# Patient Record
Sex: Male | Born: 1965
Health system: Southern US, Community
[De-identification: ages and names within clinical notes are randomized; demographics above are authoritative.]

## PROBLEM LIST (undated history)

## (undated) DIAGNOSIS — I10 Essential (primary) hypertension: Secondary | ICD-10-CM

## (undated) DIAGNOSIS — E785 Hyperlipidemia, unspecified: Secondary | ICD-10-CM

## (undated) DIAGNOSIS — G709 Myoneural disorder, unspecified: Secondary | ICD-10-CM

## (undated) HISTORY — PX: COLONOSCOPY: SHX174

## (undated) HISTORY — DX: Hyperlipidemia, unspecified: E78.5

## (undated) HISTORY — DX: Myoneural disorder, unspecified: G70.9

---

## 2004-11-07 ENCOUNTER — Ambulatory Visit: Payer: Self-pay | Admitting: Family Medicine

## 2004-11-13 ENCOUNTER — Ambulatory Visit: Payer: Self-pay | Admitting: Internal Medicine

## 2005-05-09 ENCOUNTER — Inpatient Hospital Stay (HOSPITAL_COMMUNITY): Admission: EM | Admit: 2005-05-09 | Discharge: 2005-05-11 | Payer: Self-pay | Admitting: Emergency Medicine

## 2005-05-10 ENCOUNTER — Encounter (INDEPENDENT_AMBULATORY_CARE_PROVIDER_SITE_OTHER): Payer: Self-pay | Admitting: *Deleted

## 2005-09-20 ENCOUNTER — Emergency Department (HOSPITAL_COMMUNITY): Admission: EM | Admit: 2005-09-20 | Discharge: 2005-09-20 | Payer: Self-pay | Admitting: Emergency Medicine

## 2011-01-30 ENCOUNTER — Ambulatory Visit (HOSPITAL_BASED_OUTPATIENT_CLINIC_OR_DEPARTMENT_OTHER): Payer: 59

## 2011-02-20 ENCOUNTER — Ambulatory Visit (HOSPITAL_BASED_OUTPATIENT_CLINIC_OR_DEPARTMENT_OTHER): Payer: 59 | Attending: Cardiology

## 2011-02-20 DIAGNOSIS — R0989 Other specified symptoms and signs involving the circulatory and respiratory systems: Secondary | ICD-10-CM | POA: Insufficient documentation

## 2011-02-20 DIAGNOSIS — G4730 Sleep apnea, unspecified: Secondary | ICD-10-CM | POA: Insufficient documentation

## 2011-02-20 DIAGNOSIS — G4710 Hypersomnia, unspecified: Secondary | ICD-10-CM | POA: Insufficient documentation

## 2011-02-20 DIAGNOSIS — R0609 Other forms of dyspnea: Secondary | ICD-10-CM | POA: Insufficient documentation

## 2011-02-22 ENCOUNTER — Encounter (HOSPITAL_BASED_OUTPATIENT_CLINIC_OR_DEPARTMENT_OTHER): Payer: 59

## 2011-02-24 DIAGNOSIS — G4730 Sleep apnea, unspecified: Secondary | ICD-10-CM

## 2011-02-24 DIAGNOSIS — R0989 Other specified symptoms and signs involving the circulatory and respiratory systems: Secondary | ICD-10-CM

## 2011-02-24 DIAGNOSIS — R0609 Other forms of dyspnea: Secondary | ICD-10-CM

## 2011-02-24 DIAGNOSIS — G4733 Obstructive sleep apnea (adult) (pediatric): Secondary | ICD-10-CM

## 2011-02-24 DIAGNOSIS — G4710 Hypersomnia, unspecified: Secondary | ICD-10-CM

## 2011-02-24 NOTE — Procedures (Signed)
NAMETREYVONE, CHELF                ACCOUNT NO.:  [id number]  MEDICAL RECORD NO.:  [id number]          PATIENT TYPE:  OUT  LOCATION:  SLEEP CENTER                 FACILITY:  [facility]  PHYSICIAN:  Angellica Maddison D. Maple Hudson, MD, FCCP, FACPDATE OF BIRTH:  05/18/1965  DATE OF STUDY:  02/20/2011                           NOCTURNAL POLYSOMNOGRAM  REFERRING PHYSICIAN:  JEROME O SPRUILL  INDICATION FOR STUDY:  Hypersomnia with sleep apnea.  EPWORTH SLEEPINESS SCORE:  12/24, BMI 26.6.  Weight 180 pounds, height 69 inches.  Neck 15.5 inches.  MEDICATIONS:  Home medications are charted and reviewed.  SLEEP ARCHITECTURE:  Total sleep time 247 minutes with sleep efficiency 62%.  Stage I was 16.8%, stage II 64.6%, stage III absent, REM 18.6% of total sleep time.  Sleep latency 132 minutes, REM latency 8 minutes, awake after sleep onset 19 minutes, arousal index 16.5.  BEDTIME MEDICATION:  None.  RESPIRATORY DATA:  Apnea-hypopnea index (AHI) one per hour.  A total of 4 events was scored, all as hypopneas.  The events were not positional. REM/AHI 3.9, RDI 7 per hour.  There were insufficient events to meet criteria for initiation of CPAP titration by split protocol on the study night.  OXYGEN DATA:  Mild-to-moderate snoring with oxygen desaturation to a nadir of 92% and a mean oxygen saturation through the study of 96.3% on room air.  CARDIAC DATA:  Sinus rhythm with rare PVC.  MOVEMENT-PARASOMNIA:  Occasional limb jerk with little effect on sleep. No bathroom trips.  IMPRESSIONS-RECOMMENDATIONS: 1. Delayed sleep onset until after midnight. 2. Occasional respiratory event with sleep disturbance, within normal     limits, AHI one per hour (normal range 2-5     per hour).  Mild to moderate snoring with oxygen desaturation to a     nadir of 92% and a mean oxygen saturation through the study of     96.3% on room air.     Desarea Ohagan D. Maple Hudson, MD, [facility], FACP Diplomate, Biomedical engineer of Sleep  Medicine Electronically Signed    CDY/MEDQ  D:  02/24/2011 09:48:05  T:  02/24/2011 11:05:04  Job:  [id number]

## 2011-03-16 NOTE — Discharge Summary (Signed)
Jason Pacheco, Jason Pacheco                ACCOUNT NO.:  [id number]   MEDICAL RECORD NO.:  [id number]          PATIENT TYPE:  INP   LOCATION:  3737                         FACILITY:  [facility]   PHYSICIAN:  Dani Gobble, MD       DATE OF BIRTH:  January 04, 1966   DATE OF ADMISSION:  05/09/2005  DATE OF DISCHARGE:  05/11/2005                                 DISCHARGE SUMMARY   DISCHARGE DIAGNOSES:  1.  Syncope, etiology unclear.  2.  Abnormal cardiac enzymes with electrocardiogram changes; patient      underwent cardiac catheterization this admission revealing normal      coronaries.  3.  Hyperlipidemia.  4.  Hypertension.   HPI HOSPITAL COURSE:  This is a 45 year old African-American gentleman who  has a history of significant hypertension and was recently discharged from  [facility] and because of abnormal EKG underwent 2D echo at  [facility].  The patient reported that the day  of his presentation, around 2:30 p.m., he felt that everything began to go  dark and he experienced left chest pain, but no nausea, vomiting or  diaphoresis, no palpitations, and the patient fell down suddenly and  experienced episodes of loss of consciousness probably for 5 minutes.  He  regained consciousness and probably was aroused for around 15 minutes but  then again sustained another episode of syncope for another 5 minutes.  Also  felt presyncopal over the weekend and experienced cramping and numbness in  his left leg, also felt lightheaded and had some palpitations earlier.  Because of all of his symptoms, patient was admitted to [facility]  to the Telemetry Unit on rule out MI protocol.   His EKG revealed some abnormal changes.  EKG revealed a T-wave inversion in  2, 3 and AVF and 1 and also precordial leads 3-5.  There is noticed R-wave  progression from V1-V5, but then in V6 the R-waves are lost again.   His cardiac panel __________ CK 378, MB 63 and troponin  0.01 and second set  CK 457, MB is 7.4, and troponin again 0.01.  His enzymes were mildly  abnormal but not characteristic for MI because of a low relative index.  His  relative index did not increase above 1.7.   Dr. Elsie Lincoln assessed patient on May 12, 2005, and decided to proceed with a  cardiac catheterization.   HOSPITAL PROCEDURES:  Cardiac catheterization performed on May 10, 2005,  that showed normal coronaries, ejection fraction 70-80% and no mitral  regurgitation.  His renal arteries were patent bilaterally and no __________  noted.   Fasting lipid panel revealed total cholesterol 207, triglycerides 115, HDL  65, LDL 119.   His hemoglobin was 14.5, hematocrit 43, potassium 3.9, sodium 141, BUN 9,  creatinine 1.1, glucose 99.   On the day of his discharge Dr. Domingo Sep evaluated patient and found him to  be stable for discharge home.  Patient was offered to start statin therapy  but he refused and said that he would like to try first dietary  modification.  DISCHARGE MEDICATIONS:  1.  Aspirin 81 mg daily.  2.  Lisinopril 5 mg daily.   DISCHARGE DIET:  Low fat and low salt diet.   DISCHARGE ACTIVITIES:  No driving, no lifting greater than 5 pounds, no  strenuous activities for 3 days post cath.   DISCHARGE FOLLOWUP:  Dr. Elsie Lincoln will see patient in our office on May 28, 2005, at 12 p.m.   His head CT that was performed at [facility] on admission was  negative for any hemorrhage or any acute abnormality and he might require  outpatient neurological evaluation and after Dr. Elsie Lincoln assesses patient,  post hospital, in the office we would arrange neurological consult.      Marina   MK/MEDQ  D:  05/11/2005  T:  05/11/2005  Job:  [id number]   cc:   [facility]  [address]  [place], [place] [address]

## 2011-03-16 NOTE — Cardiovascular Report (Signed)
Jason Pacheco, Jason Pacheco                ACCOUNT NO.:  [id number]   MEDICAL RECORD NO.:  [id number]          PATIENT TYPE:  INP   LOCATION:  3737                         FACILITY:  [facility]   PHYSICIAN:  Madaline Savage, M.D.DATE OF BIRTH:  July 24, 1966   DATE OF PROCEDURE:  05/10/2005  DATE OF DISCHARGE:                              CARDIAC CATHETERIZATION   PROCEDURES PERFORMED:  1.  Selective coronary angiography by Judkins technique.  2.  Retrograde left heart catheterization.  3.  Left ventricular angiography.  4.  Abdominal aortography.   ENTRY SITE:  Right femoral.   DYE USED:  Omnipaque.   COMPLICATIONS:  None.   PATIENT PROFILE:  The patient is a 45 year old African-American gentleman  who was recently diagnosed with an abnormal EKG with known hypertension. He  had a sensation of presyncope on the day of admission and was admitted to  the hospital for that reason. He was found to be in sinus rhythm with no  obvious cause of presyncopal episodes. His EKG was very abnormal with T-wave  inversions in the inferior anterolateral leads and his CKs were elevated but  his MBs were negative for myocardial infarction and his troponins were  negative. He was brought to the cath lab today to clarify whether his T-wave  abnormalities were related to ischemia or not. He tolerated the procedure  well and there were no complications.   RESULTS:  The central aortic pressure was 115/70 with a mean of 90. The left  ventricular pressure was 120/6 and end-diastolic pressure 16. There was no  significant gradient by pullback technique.   ANGIOGRAPHIC RESULTS:  The patient's coronary arteries were patent  throughout. The coronary tree consisted of a normal length and normal  diameter left main that was clean and LAD that coursed to the cardiac apex  with a wraparound configuration and a single diagonal branch arising very  proximal to the first septal perforator branch all of which were  normal.   Left circumflex coronary artery was nondominant. There were three obtuse  marginal branches, all were normal and the circumflex was normal.   Right coronary artery:  No evidence of any stenoses in this dominant vessel.  Abdominal aortography failed to show any pathology in the abdominal aorta or  in the renal arteries.   IMPRESSION:  1.  Recent syncopal episode with hypertension and markedly abnormal      electrocardiogram with T-wave inversions in inferior and anterolateral  2.  Elevated CKs with negative MBs and troponins.  3.  Angiographic patent coronary arteries.  4.  Normal left ventricular systolic function.  5.  No evidence of renal artery stenosis.   PLAN:  The patient will have further observation and investigation of his  syncope and will be reassured with regard to his coronary arteries status.       WHG/MEDQ  D:  05/10/2005  T:  05/11/2005  Job:  [id number]   cc:   [facility]   [facility]

## 2015-07-10 ENCOUNTER — Emergency Department (INDEPENDENT_AMBULATORY_CARE_PROVIDER_SITE_OTHER)
Admission: EM | Admit: 2015-07-10 | Discharge: 2015-07-10 | Disposition: A | Discharge status: Home / Self Care | Payer: Self-pay | Source: Home / Self Care | Attending: Family Medicine | Admitting: Family Medicine

## 2015-07-10 ENCOUNTER — Encounter (HOSPITAL_COMMUNITY): Payer: Self-pay | Admitting: Emergency Medicine

## 2015-07-10 DIAGNOSIS — I10 Essential (primary) hypertension: Secondary | ICD-10-CM

## 2015-07-10 HISTORY — DX: Essential (primary) hypertension: I10

## 2015-07-10 LAB — POCT I-STAT, CHEM 8
BUN: 11 mg/dL (ref 6–20)
Calcium, Ion: 1.17 mmol/L (ref 1.12–1.23)
Chloride: 105 mmol/L (ref 101–111)
Creatinine, Ser: 1.3 mg/dL — ABNORMAL HIGH (ref 0.61–1.24)
Glucose, Bld: 82 mg/dL (ref 65–99)
HCT: 50 % (ref 39.0–52.0)
Hemoglobin: 17 g/dL (ref 13.0–17.0)
Potassium: 4.4 mmol/L (ref 3.5–5.1)
Sodium: 140 mmol/L (ref 135–145)
TCO2: 22 mmol/L (ref 0–100)

## 2015-07-10 MED ORDER — LISINOPRIL-HYDROCHLOROTHIAZIDE 20-12.5 MG PO TABS: 1.0000 | ORAL_TABLET | Freq: Every day | ORAL | Status: DC

## 2015-07-10 MED ORDER — CLONIDINE HCL 0.1 MG PO TABS
ORAL_TABLET | ORAL | Status: AC
  Filled 2015-07-10: qty 1

## 2015-07-10 MED ORDER — CLONIDINE HCL 0.1 MG PO TABS
0.1000 mg | ORAL_TABLET | Freq: Once | ORAL | Status: AC
  Administered 2015-07-10: 0.1 mg via ORAL

## 2015-07-10 NOTE — ED Provider Notes (Addendum)
CSN: [id number]     Arrival date & time 07/10/15  1355 History   First MD Initiated Contact with Patient 07/10/15 1501     Chief Complaint  Patient presents with  . Hypertension  . Dizziness   (Consider location/radiation/quality/duration/timing/severity/associated sxs/prior Treatment) Patient is a 49 y.o. male presenting with hypertension and dizziness. The history is provided by the patient.  Hypertension This is a chronic problem. The current episode started more than 1 week ago. The problem occurs constantly. The problem has not changed since onset.Associated symptoms include headaches. Pertinent negatives include no chest pain, no abdominal pain and no shortness of breath. Nothing aggravates the symptoms. Nothing relieves the symptoms. He has tried nothing for the symptoms.  Dizziness Associated symptoms: headaches   Associated symptoms: no chest pain and no shortness of breath    Patient is unemployed and although his hypertension was controlled with lisinopril the past, has not been able to afford any medical care in recent weeks. Past Medical History  Diagnosis Date  . Hypertension    History reviewed. No pertinent past surgical history. No family history on file. Social History  Substance Use Topics  . Smoking status: Current Every Day Smoker  . Smokeless tobacco: None  . Alcohol Use: Yes    Review of Systems  Constitutional: Negative.   Eyes: Negative.   Respiratory: Negative.  Negative for shortness of breath.   Cardiovascular: Negative.  Negative for chest pain.  Gastrointestinal: Negative for abdominal pain.  Endocrine: Negative.   Genitourinary: Negative.   Neurological: Positive for dizziness and headaches.    Allergies  Review of patient's allergies indicates no known allergies.  Home Medications   Prior to Admission medications   Not on File   Meds Ordered and Administered this Visit  Medications - No data to display  BP 196/124 mmHg  Pulse 60   Temp(Src) 99.3 F (37.4 C) (Oral)  Resp 18  SpO2 99% No data found.   Physical Exam  ED Course  Procedures (including critical care time)  Labs Review Results for orders placed or performed during the hospital encounter of 07/10/15  I-STAT, chem 8  Result Value Ref Range   Sodium 140 135 - 145 mmol/L   Potassium 4.4 3.5 - 5.1 mmol/L   Chloride 105 101 - 111 mmol/L   BUN 11 6 - 20 mg/dL   Creatinine, Ser 1.30 (H) 0.61 - 1.24 mg/dL   Glucose, Bld 82 65 - 99 mg/dL   Calcium, Ion 1.17 1.12 - 1.23 mmol/L   TCO2 22 0 - 100 mmol/L   Hemoglobin 17.0 13.0 - 17.0 g/dL   HCT 50.0 39.0 - 52.0 %      MDM   Patient cautioned to take his medicine and get follow-up. He has some early signs of renal impairment and the swelling get worse if he does not pay attention to this serious problem.    ICD-9-CM ICD-10-CM   1. Essential hypertension 401.9 I10 lisinopril-hydrochlorothiazide (ZESTORETIC) 20-12.5 MG per tablet    Clonidine 0.1 mg po here in the department  Signed, Robyn Haber M.D.   Robyn Haber, MD 07/10/15 1525

## 2015-07-10 NOTE — Discharge Instructions (Signed)

## 2015-07-10 NOTE — ED Notes (Signed)
Patient has a history of high blood pressure.  Patient has been off blood pressure medicines for 4-5 years.  Patient reports frontal headaches and dizziness over the past week have intensified.

## 2015-07-10 NOTE — ED Notes (Signed)
Provided patient with maggie mena, financial account specialist's contact information.  No insurance, no pcp

## 2015-12-13 ENCOUNTER — Encounter (HOSPITAL_COMMUNITY): Payer: Self-pay | Admitting: Emergency Medicine

## 2015-12-13 ENCOUNTER — Emergency Department (HOSPITAL_COMMUNITY)
Admission: EM | Admit: 2015-12-13 | Discharge: 2015-12-13 | Disposition: A | Discharge status: Home / Self Care | Payer: Self-pay | Source: Home / Self Care | Attending: Family Medicine | Admitting: Family Medicine

## 2015-12-13 DIAGNOSIS — I10 Essential (primary) hypertension: Secondary | ICD-10-CM

## 2015-12-13 MED ORDER — LISINOPRIL-HYDROCHLOROTHIAZIDE 20-12.5 MG PO TABS: 1.0000 | ORAL_TABLET | Freq: Every day | ORAL | Status: DC

## 2015-12-13 NOTE — ED Notes (Signed)
Here needing refills on his BP meds A&O x4.. No acute distress.

## 2015-12-13 NOTE — ED Provider Notes (Signed)
CSN: [id number]     Arrival date & time 12/13/15  1650 History   First MD Initiated Contact with Patient 12/13/15 1735     Chief Complaint  Patient presents with  . Medication Refill   (Consider location/radiation/quality/duration/timing/severity/associated sxs/prior Treatment) HPI Presents for refill of lisinopril. Has been doing well. Not established with PCP. No complaints Past Medical History  Diagnosis Date  . Hypertension    History reviewed. No pertinent past surgical history. No family history on file. Social History  Substance Use Topics  . Smoking status: Current Every Day Smoker  . Smokeless tobacco: None  . Alcohol Use: Yes    Review of Systems Refill of medications. No CP, SOB, HA Allergies  Review of patient's allergies indicates no known allergies.  Home Medications   Prior to Admission medications   Medication Sig Start Date End Date Taking? Authorizing Provider  lisinopril-hydrochlorothiazide (ZESTORETIC) 20-12.5 MG tablet Take 1 tablet by mouth daily. 12/13/15   Konrad Felix, PA  lisinopril-hydrochlorothiazide (ZESTORETIC) 20-12.5 MG tablet Take 1 tablet by mouth daily. 12/13/15   Konrad Felix, PA   Meds Ordered and Administered this Visit  Medications - No data to display  BP 141/88 mmHg  Pulse 83  Temp(Src) 98.9 F (37.2 C) (Oral)  Resp 16  SpO2 98% No data found.   Physical Exam  Constitutional: He is oriented to person, place, and time. He appears well-developed and well-nourished. No distress.  Eyes: Conjunctivae are normal.  Neck: Normal range of motion. Neck supple.  Cardiovascular: Normal rate.   Pulmonary/Chest: Effort normal.  Musculoskeletal: Normal range of motion.  Neurological: He is alert and oriented to person, place, and time.  Skin: Skin is warm and dry.  Psychiatric: He has a normal mood and affect. His behavior is normal.  Nursing note and vitals reviewed.   ED Course  Procedures (including critical care  time)  Labs Review Labs Reviewed - No data to display  Imaging Review No results found.   Visual Acuity Review  Right Eye Distance:   Left Eye Distance:   Bilateral Distance:    Right Eye Near:   Left Eye Near:    Bilateral Near:         MDM   1. Essential hypertension    Patient is advised to continue home symptomatic treatment. Prescription for lisinopril sent pharmacy patient has indicated. Patient is advised that if there are new or worsening symptoms or attend the emergency department, or contact primary care provider. Instructions of care provided discharged home in stable condition. Return to work/school note provided.  THIS NOTE WAS GENERATED USING A VOICE RECOGNITION SOFTWARE PROGRAM. ALL REASONABLE EFFORTS  WERE MADE TO PROOFREAD THIS DOCUMENT FOR ACCURACY.     Konrad Felix, PA 12/13/15 1800

## 2015-12-13 NOTE — Discharge Instructions (Signed)
Hypertension Hypertension, commonly called high blood pressure, is when the force of blood pumping through your arteries is too strong. Your arteries are the blood vessels that carry blood from your heart throughout your body. A blood pressure reading consists of a higher number over a lower number, such as 110/72. The higher number (systolic) is the pressure inside your arteries when your heart pumps. The lower number (diastolic) is the pressure inside your arteries when your heart relaxes. Ideally you want your blood pressure below 120/80. Hypertension forces your heart to work harder to pump blood. Your arteries may become narrow or stiff. Having untreated or uncontrolled hypertension can cause heart attack, stroke, kidney disease, and other problems. RISK FACTORS Some risk factors for high blood pressure are controllable. Others are not.  Risk factors you cannot control include:   Race. You may be at higher risk if you are African American.  Age. Risk increases with age.  Gender. Men are at higher risk than women before age 45 years. After age 65, women are at higher risk than men. Risk factors you can control include:  Not getting enough exercise or physical activity.  Being overweight.  Getting too much fat, sugar, calories, or salt in your diet.  Drinking too much alcohol. SIGNS AND SYMPTOMS Hypertension does not usually cause signs or symptoms. Extremely high blood pressure (hypertensive crisis) may cause headache, anxiety, shortness of breath, and nosebleed. DIAGNOSIS To check if you have hypertension, your health care provider will measure your blood pressure while you are seated, with your arm held at the level of your heart. It should be measured at least twice using the same arm. Certain conditions can cause a difference in blood pressure between your right and left arms. A blood pressure reading that is higher than normal on one occasion does not mean that you need treatment. If  it is not clear whether you have high blood pressure, you may be asked to return on a different day to have your blood pressure checked again. Or, you may be asked to monitor your blood pressure at home for 1 or more weeks. TREATMENT Treating high blood pressure includes making lifestyle changes and possibly taking medicine. Living a healthy lifestyle can help lower high blood pressure. You may need to change some of your habits. Lifestyle changes may include:  Following the DASH diet. This diet is high in fruits, vegetables, and whole grains. It is low in salt, red meat, and added sugars.  Keep your sodium intake below 2,300 mg per day.  Getting at least 30-45 minutes of aerobic exercise at least 4 times per week.  Losing weight if necessary.  Not smoking.  Limiting alcoholic beverages.  Learning ways to reduce stress. Your health care provider may prescribe medicine if lifestyle changes are not enough to get your blood pressure under control, and if one of the following is true:  You are 18-59 years of age and your systolic blood pressure is above 140.  You are 60 years of age or older, and your systolic blood pressure is above 150.  Your diastolic blood pressure is above 90.  You have diabetes, and your systolic blood pressure is over 140 or your diastolic blood pressure is over 90.  You have kidney disease and your blood pressure is above 140/90.  You have heart disease and your blood pressure is above 140/90. Your personal target blood pressure may vary depending on your medical conditions, your age, and other factors. HOME CARE INSTRUCTIONS    Have your blood pressure rechecked as directed by your health care provider.   Take medicines only as directed by your health care provider. Follow the directions carefully. Blood pressure medicines must be taken as prescribed. The medicine does not work as well when you skip doses. Skipping doses also puts you at risk for  problems.  Do not smoke.   Monitor your blood pressure at home as directed by your health care provider. SEEK MEDICAL CARE IF:   You think you are having a reaction to medicines taken.  You have recurrent headaches or feel dizzy.  You have swelling in your ankles.  You have trouble with your vision. SEEK IMMEDIATE MEDICAL CARE IF:  You develop a severe headache or confusion.  You have unusual weakness, numbness, or feel faint.  You have severe chest or abdominal pain.  You vomit repeatedly.  You have trouble breathing. MAKE SURE YOU:   Understand these instructions.  Will watch your condition.  Will get help right away if you are not doing well or get worse.   This information is not intended to replace advice given to you by your health care provider. Make sure you discuss any questions you have with your health care provider.   Document Released: 10/15/2005 Document Revised: 03/01/2015 Document Reviewed: 08/07/2013 Elsevier Interactive Patient Education 2016 [employer] Inc.  

## 2015-12-13 NOTE — ED Notes (Signed)
D/c by frank patrick, pa 

## 2015-12-17 ENCOUNTER — Encounter (HOSPITAL_COMMUNITY): Payer: Self-pay | Admitting: Emergency Medicine

## 2015-12-17 ENCOUNTER — Emergency Department (HOSPITAL_COMMUNITY): Payer: Self-pay

## 2015-12-17 ENCOUNTER — Observation Stay (HOSPITAL_COMMUNITY)
Admission: EM | Admit: 2015-12-17 | Discharge: 2015-12-20 | Disposition: A | Discharge status: Home / Self Care | Payer: Self-pay | Attending: Internal Medicine | Admitting: Internal Medicine

## 2015-12-17 DIAGNOSIS — Z23 Encounter for immunization: Secondary | ICD-10-CM | POA: Insufficient documentation

## 2015-12-17 DIAGNOSIS — I10 Essential (primary) hypertension: Secondary | ICD-10-CM

## 2015-12-17 DIAGNOSIS — R200 Anesthesia of skin: Secondary | ICD-10-CM | POA: Insufficient documentation

## 2015-12-17 DIAGNOSIS — F17210 Nicotine dependence, cigarettes, uncomplicated: Secondary | ICD-10-CM | POA: Insufficient documentation

## 2015-12-17 DIAGNOSIS — R55 Syncope and collapse: Principal | ICD-10-CM | POA: Diagnosis present

## 2015-12-17 DIAGNOSIS — N179 Acute kidney failure, unspecified: Secondary | ICD-10-CM | POA: Insufficient documentation

## 2015-12-17 DIAGNOSIS — M79602 Pain in left arm: Secondary | ICD-10-CM | POA: Diagnosis present

## 2015-12-17 DIAGNOSIS — M79601 Pain in right arm: Secondary | ICD-10-CM | POA: Insufficient documentation

## 2015-12-17 LAB — I-STAT TROPONIN, ED
Troponin i, poc: 0 ng/mL (ref 0.00–0.08)
Troponin i, poc: 0 ng/mL (ref 0.00–0.08)

## 2015-12-17 LAB — COMPREHENSIVE METABOLIC PANEL
ALT: 25 U/L (ref 17–63)
AST: 28 U/L (ref 15–41)
Albumin: 3.8 g/dL (ref 3.5–5.0)
Alkaline Phosphatase: 78 U/L (ref 38–126)
Anion gap: 14 (ref 5–15)
BUN: 15 mg/dL (ref 6–20)
CO2: 22 mmol/L (ref 22–32)
Calcium: 9.5 mg/dL (ref 8.9–10.3)
Chloride: 103 mmol/L (ref 101–111)
Creatinine, Ser: 1.51 mg/dL — ABNORMAL HIGH (ref 0.61–1.24)
GFR calc Af Amer: >60 mL/min (ref >60)
GFR calc non Af Amer: 52 mL/min — ABNORMAL LOW (ref >60)
Glucose, Bld: 97 mg/dL (ref 65–99)
Potassium: 4.3 mmol/L (ref 3.5–5.1)
Sodium: 139 mmol/L (ref 135–145)
Total Bilirubin: 0.7 mg/dL (ref 0.3–1.2)
Total Protein: 7.2 g/dL (ref 6.5–8.1)

## 2015-12-17 LAB — ETHANOL: ALCOHOL ETHYL (B): 15 mg/dL — AB (ref <5)

## 2015-12-17 LAB — CBC WITH DIFFERENTIAL/PLATELET
Basophils Absolute: 0 10*3/uL (ref 0.0–0.1)
Basophils Relative: 1 %
Eosinophils Absolute: 0.1 10*3/uL (ref 0.0–0.7)
Eosinophils Relative: 2 %
HCT: 44.6 % (ref 39.0–52.0)
Hemoglobin: 15.1 g/dL (ref 13.0–17.0)
Lymphocytes Relative: 16 %
Lymphs Abs: 1.1 10*3/uL (ref 0.7–4.0)
MCH: 28.8 pg (ref 26.0–34.0)
MCHC: 33.9 g/dL (ref 30.0–36.0)
MCV: 85 fL (ref 78.0–100.0)
Monocytes Absolute: 0.5 10*3/uL (ref 0.1–1.0)
Monocytes Relative: 7 %
Neutro Abs: 4.9 10*3/uL (ref 1.7–7.7)
Neutrophils Relative %: 74 %
Platelets: 313 10*3/uL (ref 150–400)
RBC: 5.25 MIL/uL (ref 4.22–5.81)
RDW: 13.4 % (ref 11.5–15.5)
WBC: 6.5 10*3/uL (ref 4.0–10.5)

## 2015-12-17 LAB — URINALYSIS, ROUTINE W REFLEX MICROSCOPIC
Bilirubin Urine: NEGATIVE
Glucose, UA: NEGATIVE mg/dL
Hgb urine dipstick: NEGATIVE
Ketones, ur: 15 mg/dL — AB
Leukocytes, UA: NEGATIVE
Nitrite: NEGATIVE
Protein, ur: NEGATIVE mg/dL
Specific Gravity, Urine: 1.018 (ref 1.005–1.030)
pH: 6 (ref 5.0–8.0)

## 2015-12-17 LAB — TROPONIN I: Troponin I: <0.03 ng/mL

## 2015-12-17 MED ORDER — INFLUENZA VAC SPLIT QUAD 0.5 ML IM SUSY
0.5000 mL | PREFILLED_SYRINGE | INTRAMUSCULAR | Status: AC
  Administered 2015-12-19: 0.5 mL via INTRAMUSCULAR
  Filled 2015-12-17: qty 0.5

## 2015-12-17 MED ORDER — SODIUM CHLORIDE 0.9% FLUSH
3.0000 mL | Freq: Two times a day (BID) | INTRAVENOUS | Status: DC
  Administered 2015-12-18 – 2015-12-19 (×4): 3 mL via INTRAVENOUS

## 2015-12-17 MED ORDER — ALUM & MAG HYDROXIDE-SIMETH 200-200-20 MG/5ML PO SUSP
30.0000 mL | Freq: Four times a day (QID) | ORAL | Status: DC | PRN
Start: 2015-12-17 — End: 2015-12-20

## 2015-12-17 MED ORDER — THIAMINE HCL 100 MG/ML IJ SOLN
100.0000 mg | Freq: Every day | INTRAMUSCULAR | Status: DC
  Filled 2015-12-17: qty 2

## 2015-12-17 MED ORDER — OXYCODONE HCL 5 MG PO TABS
5.0000 mg | ORAL_TABLET | ORAL | Status: DC | PRN
  Administered 2015-12-17 – 2015-12-19 (×3): 5 mg via ORAL
  Filled 2015-12-17 (×3): qty 1

## 2015-12-17 MED ORDER — NICOTINE 14 MG/24HR TD PT24
14.0000 mg | MEDICATED_PATCH | Freq: Every day | TRANSDERMAL | Status: DC
  Administered 2015-12-18 – 2015-12-20 (×3): 14 mg via TRANSDERMAL
  Filled 2015-12-17 (×5): qty 1

## 2015-12-17 MED ORDER — ACETAMINOPHEN 325 MG PO TABS: 650.0000 mg | ORAL_TABLET | Freq: Four times a day (QID) | ORAL | Status: DC | PRN

## 2015-12-17 MED ORDER — ONDANSETRON HCL 4 MG/2ML IJ SOLN: 4.0000 mg | Freq: Four times a day (QID) | INTRAMUSCULAR | Status: DC | PRN

## 2015-12-17 MED ORDER — ENOXAPARIN SODIUM 40 MG/0.4ML ~~LOC~~ SOLN
40.0000 mg | SUBCUTANEOUS | Status: DC
  Administered 2015-12-17 – 2015-12-19 (×3): 40 mg via SUBCUTANEOUS
  Filled 2015-12-17 (×3): qty 0.4

## 2015-12-17 MED ORDER — SODIUM CHLORIDE 0.9 % IV SOLN
INTRAVENOUS | Status: AC
  Administered 2015-12-17: 22:00:00 via INTRAVENOUS

## 2015-12-17 MED ORDER — LORAZEPAM 1 MG PO TABS: 0.0000 mg | ORAL_TABLET | Freq: Two times a day (BID) | ORAL | Status: DC

## 2015-12-17 MED ORDER — HYDROMORPHONE HCL 1 MG/ML IJ SOLN: 0.5000 mg | INTRAMUSCULAR | Status: DC | PRN

## 2015-12-17 MED ORDER — ONDANSETRON HCL 4 MG PO TABS: 4.0000 mg | ORAL_TABLET | Freq: Four times a day (QID) | ORAL | Status: DC | PRN

## 2015-12-17 MED ORDER — ASPIRIN 325 MG PO TABS
325.0000 mg | ORAL_TABLET | Freq: Every day | ORAL | Status: DC
  Administered 2015-12-18 – 2015-12-20 (×3): 325 mg via ORAL
  Filled 2015-12-17 (×3): qty 1

## 2015-12-17 MED ORDER — SODIUM CHLORIDE 0.9 % IV BOLUS (SEPSIS)
1000.0000 mL | Freq: Once | INTRAVENOUS | Status: AC
  Administered 2015-12-17: 1000 mL via INTRAVENOUS

## 2015-12-17 MED ORDER — ACETAMINOPHEN 650 MG RE SUPP: 650.0000 mg | Freq: Four times a day (QID) | RECTAL | Status: DC | PRN

## 2015-12-17 MED ORDER — VITAMIN B-1 100 MG PO TABS
100.0000 mg | ORAL_TABLET | Freq: Every day | ORAL | Status: DC
  Administered 2015-12-18 – 2015-12-20 (×3): 100 mg via ORAL
  Filled 2015-12-17 (×3): qty 1

## 2015-12-17 MED ORDER — LORAZEPAM 1 MG PO TABS
1.0000 mg | ORAL_TABLET | Freq: Four times a day (QID) | ORAL | Status: DC | PRN
  Administered 2015-12-18: 1 mg via ORAL

## 2015-12-17 MED ORDER — LORAZEPAM 1 MG PO TABS
0.0000 mg | ORAL_TABLET | Freq: Four times a day (QID) | ORAL | Status: AC
Start: 2015-12-17 — End: 2015-12-19
  Filled 2015-12-17: qty 1

## 2015-12-17 MED ORDER — LORAZEPAM 2 MG/ML IJ SOLN: 1.0000 mg | Freq: Four times a day (QID) | INTRAMUSCULAR | Status: DC | PRN

## 2015-12-17 MED ORDER — FOLIC ACID 1 MG PO TABS
1.0000 mg | ORAL_TABLET | Freq: Every day | ORAL | Status: DC
  Administered 2015-12-18 – 2015-12-20 (×3): 1 mg via ORAL
  Filled 2015-12-17 (×4): qty 1

## 2015-12-17 MED ORDER — ADULT MULTIVITAMIN W/MINERALS CH
1.0000 | ORAL_TABLET | Freq: Every day | ORAL | Status: DC
  Administered 2015-12-18 – 2015-12-20 (×3): 1 via ORAL
  Filled 2015-12-17 (×4): qty 1

## 2015-12-17 NOTE — ED Notes (Signed)
Pt to ER via [facility] from home where patient had syncopal episode. Pt is a daily beer drinker, today pt reports two 40 oz ETOH on board. Per EMS, 12 lead showing elevation in V3, V4, and V5. Pt denies chest pain. Pt was given 324 of aspirin in route. Pt is a/o x4. Pt has c collar in place. Denies pain except in bilateral hands, pt has skin tear to right middle finger.

## 2015-12-17 NOTE — ED Provider Notes (Signed)
CSN: [id number]     Arrival date & time 12/17/15  1423 History   First MD Initiated Contact with Patient 12/17/15 1522     Chief Complaint  Patient presents with  . Loss of Consciousness     (Consider location/radiation/quality/duration/timing/severity/associated sxs/prior Treatment) HPI Patient presents to the emergency department with a syncopal episode that occurred just prior to arrival.  Patient states he had been drinking 240 ounce beers today, but had a sudden syncopal episode.  The patient states that not having any chest pain, shortness of breath, nausea, vomiting, headache, blurred vision, weakness, dizziness, back pain, neck pain, fever, rash, cough, pain, numbness, lightheadedness, or edema.  The patient states that nothing seemed to make his condition, better or worse.  The patient states that he is normally everyday drinker Past Medical History  Diagnosis Date  . Hypertension    History reviewed. No pertinent past surgical history. History reviewed. No pertinent family history. Social History  Substance Use Topics  . Smoking status: Current Every Day Smoker -- 1.00 packs/day    Types: Cigarettes  . Smokeless tobacco: None  . Alcohol Use: Yes     Comment: 2 40 oz    Review of Systems  All other systems negative except as documented in the HPI. All pertinent positives and negatives as reviewed in the HPI.  Allergies  Review of patient's allergies indicates no known allergies.  Home Medications   Prior to Admission medications   Medication Sig Start Date End Date Taking? Authorizing Provider  lisinopril-hydrochlorothiazide (ZESTORETIC) 20-12.5 MG tablet Take 1 tablet by mouth daily. 12/13/15  Yes Konrad Felix, PA  lisinopril-hydrochlorothiazide (ZESTORETIC) 20-12.5 MG tablet Take 1 tablet by mouth daily. 12/13/15   Konrad Felix, PA   BP 156/104 mmHg  Pulse 78  Temp(Src) 98 F (36.7 C) (Oral)  Resp 18  SpO2 100% Physical Exam  Constitutional: He is  oriented to person, place, and time. He appears well-developed and well-nourished. No distress.  HENT:  Head: Normocephalic and atraumatic.  Mouth/Throat: Oropharynx is clear and moist.  Eyes: Pupils are equal, round, and reactive to light.  Neck: Normal range of motion. Neck supple.  Cardiovascular: Normal rate, regular rhythm and normal heart sounds.  Exam reveals no gallop and no friction rub.   No murmur heard. Pulmonary/Chest: Effort normal and breath sounds normal. No respiratory distress. He has no wheezes.  Abdominal: Soft. Bowel sounds are normal. He exhibits no distension. There is no tenderness.  Neurological: He is alert and oriented to person, place, and time. He exhibits normal muscle tone. Coordination normal.  Skin: Skin is warm and dry. No rash noted. No erythema.  Psychiatric: He has a normal mood and affect. His behavior is normal.  Nursing note and vitals reviewed.   ED Course  Procedures (including critical care time) Labs Review Labs Reviewed  URINALYSIS, ROUTINE W REFLEX MICROSCOPIC (NOT AT [facility]) - Abnormal; Notable for the following:    Ketones, ur 15 (*)    All other components within normal limits  COMPREHENSIVE METABOLIC PANEL - Abnormal; Notable for the following:    Creatinine, Ser 1.51 (*)    GFR calc non Af Amer 52 (*)    All other components within normal limits  CBC WITH DIFFERENTIAL/PLATELET  ETHANOL  I-STAT TROPOININ, ED  Randolm Idol, ED    Imaging Review Dg Chest 2 View  12/17/2015  CLINICAL DATA:  Syncope. EXAM: CHEST  2 VIEW COMPARISON:  09/20/2005 FINDINGS: Normal sized heart. Clear lungs with  normal vascularity. Mild thoracic spine degenerative changes. IMPRESSION: No acute abnormality. Electronically Signed   By: Claudie Revering M.D.   On: 12/17/2015 16:30   I have personally reviewed and evaluated these images and lab results as part of my medical decision-making.   EKG Interpretation   Date/Time:  Saturday December 17 2015  14:31:04 EST Ventricular Rate:  81 PR Interval:  187 QRS Duration: 74 QT Interval:  332 QTC Calculation: 385 R Axis:   72 Text Interpretation:  Sinus rhythm Probable left atrial enlargement  Anterior infarct, old Borderline ST elevation, lateral leads Abnormal ekg  Confirmed by Gerald Leitz ([id number]) on 12/17/2015 3:20:31 PM      MDM   Final diagnoses:  None   Patient be admitted for his syncopal episode due to the fact that was an unprovoked episode patient is advised with plan at all questions were answered.  Spoke with the [facility] who will admit the patient     Dalia Heading, PA-C 12/19/15 Power, MD 12/21/15 [phone]

## 2015-12-17 NOTE — ED Notes (Signed)
Attempted report x1. 

## 2015-12-17 NOTE — ED Notes (Signed)
Pt requested wound care to the abrasion on his right middle finger.  Saline applied, and bandaid applied to area.

## 2015-12-17 NOTE — H&P (Addendum)
[facility] Admission History and Physical       Jason Pacheco [id number] DOB: Jun 18, 1966 DOA: 12/17/2015  Referring physician:  EDP PCP: No PCP Per Patient  Specialists:   Chief Complaint:  Passed Out  HPI: Jason Pacheco is a 50 y.o. male with a history of HTN who presents with complaints of passing out this afternoon at his home.   He denies any prodrome, denies any headache, chest pain or dizziness.  He was with his friends who witnessed the event.   He drank a total of 2 ( 40 ounce) beers today.    He reports that he drinks 1-2 beers daily, and more on Saturdays with his friends.    He also has complaints of having intermittent pain in both of his arms for the past week.      Review of Systems:  Constitutional: No Weight Loss, No Weight Gain, Night Sweats, Fevers, Chills, Dizziness, Light Headedness, Fatigue, or Generalized Weakness HEENT: No Headaches, Difficulty Swallowing,Tooth/Dental Problems,Sore Throat,  No Sneezing, Rhinitis, Ear Ache, Nasal Congestion, or Post Nasal Drip,  Cardio-vascular:  No Chest pain, Orthopnea, PND, Edema in Lower Extremities, Anasarca, Dizziness, Palpitations  Resp: No Dyspnea, No DOE, No Productive Cough, No Non-Productive Cough, No Hemoptysis, No Wheezing.    GI: No Heartburn, Indigestion, Abdominal Pain, Nausea, Vomiting, Diarrhea, Constipation, Hematemesis, Hematochezia, Melena, Change in Bowel Habits,  Loss of Appetite  GU: No Dysuria, No Change in Color of Urine, No Urgency or Urinary Frequency, No Flank pain.  Musculoskeletal: No Joint Pain or Swelling, No Decreased Range of Motion, No Back Pain.  Neurologic: + Syncope, No Seizures, Muscle Weakness, Paresthesia, Vision Disturbance or Loss, No Diplopia, No Vertigo, No Difficulty Walking,  Skin: No Rash or Lesions. Psych: No Change in Mood or Affect, No Depression or Anxiety, No Memory loss, No Confusion, or Hallucinations   Past Medical History  Diagnosis Date  . Hypertension       History reviewed. No pertinent past surgical history.    Prior to Admission medications   Medication Sig Start Date End Date Taking? Authorizing Provider  lisinopril-hydrochlorothiazide (ZESTORETIC) 20-12.5 MG tablet Take 1 tablet by mouth daily. 12/13/15  Yes Konrad Felix, PA  lisinopril-hydrochlorothiazide (ZESTORETIC) 20-12.5 MG tablet Take 1 tablet by mouth daily. 12/13/15   Konrad Felix, PA     No Known Allergies    Social History:  reports that he has been smoking Cigarettes.  He has been smoking about 1.00 pack per day. He does not have any smokeless tobacco history on file. He reports that he drinks alcohol. He reports that he does not use illicit drugs.     Family History:  CAD in Maternal Grandmother  Strong Family History of HTN   Physical Exam:  GEN:  Pleasant Well Nourished and Well Developed 50 y.o. male examined and in no acute distress; cooperative with exam Filed Vitals:   12/17/15 1830 12/17/15 1841 12/17/15 1845 12/17/15 1953  BP: 133/89 133/89 132/94 156/104  Pulse: 82 74 85 78  Temp:      TempSrc:      Resp: 24 18 19 18   SpO2: 100% 99% 98% 100%   Blood pressure 156/104, pulse 78, temperature 98 F (36.7 C), temperature source Oral, resp. rate 18, SpO2 100 %. PSYCH: He is alert and oriented x4; does not appear anxious does not appear depressed; affect is normal HEENT: Normocephalic and Atraumatic, Mucous membranes pink; PERRLA; EOM intact; Fundi:  Benign;  No scleral icterus, Nares: Patent, Oropharynx:  Clear, Fair Dentition,    Neck:  FROM, No Cervical Lymphadenopathy nor Thyromegaly or Carotid Bruit; No JVD; Breasts:: Not examined CHEST WALL: No tenderness CHEST: Normal respiration, clear to auscultation bilaterally HEART: Regular rate and rhythm; no murmurs rubs or gallops BACK: No kyphosis or scoliosis; No CVA tenderness ABDOMEN: Positive Bowel Sounds, Soft Non-Tender, No Rebound or Guarding; No Masses, No Organomegaly Rectal Exam: Not  done EXTREMITIES: No Cyanosis, Clubbing, or Edema; No Ulcerations. Genitalia: not examined PULSES: 2+ and symmetric SKIN: Normal hydration no rash or ulceration CNS:  Alert and Oriented x 4, No Focal Deficits Vascular: pulses palpable throughout    Labs on Admission:  Basic Metabolic Panel:  Recent Labs Lab 12/17/15 1556  NA 139  K 4.3  CL 103  CO2 22  GLUCOSE 97  BUN 15  CREATININE 1.51*  CALCIUM 9.5   Liver Function Tests:  Recent Labs Lab 12/17/15 1556  AST 28  ALT 25  ALKPHOS 78  BILITOT 0.7  PROT 7.2  ALBUMIN 3.8   No results for input(s): LIPASE, AMYLASE in the last 168 hours. No results for input(s): AMMONIA in the last 168 hours. CBC:  Recent Labs Lab 12/17/15 1556  WBC 6.5  NEUTROABS 4.9  HGB 15.1  HCT 44.6  MCV 85.0  PLT 313   Cardiac Enzymes: No results for input(s): CKTOTAL, CKMB, CKMBINDEX, TROPONINI in the last 168 hours.  BNP (last 3 results) No results for input(s): BNP in the last 8760 hours.  ProBNP (last 3 results) No results for input(s): PROBNP in the last 8760 hours.  CBG: No results for input(s): GLUCAP in the last 168 hours.  Radiological Exams on Admission: Dg Chest 2 View  12/17/2015  CLINICAL DATA:  Syncope. EXAM: CHEST  2 VIEW COMPARISON:  09/20/2005 FINDINGS: Normal sized heart. Clear lungs with normal vascularity. Mild thoracic spine degenerative changes. IMPRESSION: No acute abnormality. Electronically Signed   By: Claudie Revering M.D.   On: 12/17/2015 16:30     EKG: Independently reviewed. Normal Sinus Rhythm rate = 84,  Early Repolarization Changes      Assessment/Plan:   50 y.o. male with  Principal Problem:    1.    Syncope    Cardiac monitoring for possible arrhythmia    Check Orthostatics    IVFs for rehydration, hold Lisinopril/HCTZ      Active Problems:    2.    Essential hypertension    Hold Lisinopril/HCTz    PRN IV Hydralazine    3.   Abnormalities on EKG- Early Repolarization and Mild LVH  Changes    Cardiac Monitoring    Cycle Troponin         4.    Bilateral arm pain    X-ray of C-Spine    Cycle Troponins    5.    DVT Prophylaxis    Lovenox    6.    CIWA protocol ordered    7.    Nicotine Patch dialy  Code Status:     FULL CODE       Family Communication:   No Family Present    Disposition Plan:   Observation Status        Time spent:  80 Minutes      Theressa Millard [facility] Pager [phone]   If [place] Please Contact the Day Rounding Team MD for [facility]  If 7PM-7AM, Please Contact Night-Floor Coverage  [email or web address] Password [email or web address] 12/17/2015, 8:10 PM     ADDENDUM:  Patient was seen and examined on 12/17/2015

## 2015-12-17 NOTE — ED Notes (Signed)
Pt ambulated in the hallway.  Denies dizziness/light-headedness.  Gait steady and even.

## 2015-12-18 ENCOUNTER — Observation Stay (HOSPITAL_COMMUNITY): Payer: Self-pay

## 2015-12-18 DIAGNOSIS — I10 Essential (primary) hypertension: Secondary | ICD-10-CM

## 2015-12-18 DIAGNOSIS — R200 Anesthesia of skin: Secondary | ICD-10-CM | POA: Insufficient documentation

## 2015-12-18 DIAGNOSIS — R208 Other disturbances of skin sensation: Secondary | ICD-10-CM

## 2015-12-18 DIAGNOSIS — M79601 Pain in right arm: Secondary | ICD-10-CM

## 2015-12-18 DIAGNOSIS — M79602 Pain in left arm: Secondary | ICD-10-CM

## 2015-12-18 DIAGNOSIS — R55 Syncope and collapse: Secondary | ICD-10-CM

## 2015-12-18 LAB — CBC
HEMATOCRIT: 41.2 % (ref 39.0–52.0)
HEMOGLOBIN: 13.5 g/dL (ref 13.0–17.0)
MCH: 28.7 pg (ref 26.0–34.0)
MCHC: 32.8 g/dL (ref 30.0–36.0)
MCV: 87.7 fL (ref 78.0–100.0)
Platelets: 197 10*3/uL (ref 150–400)
RBC: 4.7 MIL/uL (ref 4.22–5.81)
RDW: 13.4 % (ref 11.5–15.5)
WBC: 5.5 10*3/uL (ref 4.0–10.5)

## 2015-12-18 LAB — BASIC METABOLIC PANEL
ANION GAP: 6 (ref 5–15)
BUN: 13 mg/dL (ref 6–20)
CHLORIDE: 110 mmol/L (ref 101–111)
CO2: 23 mmol/L (ref 22–32)
Calcium: 8.2 mg/dL — ABNORMAL LOW (ref 8.9–10.3)
Creatinine, Ser: 1.12 mg/dL (ref 0.61–1.24)
Glucose, Bld: 102 mg/dL — ABNORMAL HIGH (ref 65–99)
POTASSIUM: 3.9 mmol/L (ref 3.5–5.1)
SODIUM: 139 mmol/L (ref 135–145)

## 2015-12-18 LAB — TROPONIN I: Troponin I: <0.03 ng/mL (ref <0.031)

## 2015-12-18 MED ORDER — TERBINAFINE HCL 1 % EX CREA
TOPICAL_CREAM | Freq: Every day | CUTANEOUS | Status: DC
  Administered 2015-12-18 – 2015-12-20 (×2): via TOPICAL
  Filled 2015-12-18 (×2): qty 12

## 2015-12-18 NOTE — Progress Notes (Signed)
[facility] PROGRESS NOTE   Jason Pacheco [id number] DOB: 1966/07/26 DOA: 12/17/2015 PCP: No PCP Per Patient  HPI/Subjective: Denies he complains  Assessment/Plan: Principal Problem:   Syncope Active Problems:   Essential hypertension   Bilateral arm pain  Syncope Cardiac monitoring for possible arrhythmia Check Orthostatics IVFs for rehydration, hold Lisinopril/HCTZ  Essential hypertension Hold Lisinopril/HCTz PRN IV Hydralazine  Abnormalities on EKG- Early Repolarization and Mild LVH Changes Cardiac Monitoring Cycle Troponin  Bilateral arm pain X-ray of C-Spine, complaining about some numbness every now and then. Spine x-ray showed mild disease most prominent at C4-5.  Acute renal failure Patient presented with creatinine of 1.3, likely secondary to dehydration, patient is also on lisinopril/HCTZ held. This is resolved creatinine is 1.1.  Code Status: Full Code Family Communication: Plan discussed with the patient. Disposition Plan: Remains inpatient Diet: Diet Heart Room service appropriate?: Yes; Fluid consistency:: Thin  Consultants:  none  Procedures:  2-D echo pending  Antibiotics:  No antibiotics   Objective: Filed Vitals:   12/18/15 0631 12/18/15 1302  BP: 160/105 148/94  Pulse: 70 72  Temp: 98.2 F (36.8 C) 98.6 F (37 C)  Resp: 18 20    Intake/Output Summary (Last 24 hours) at 12/18/15 1436 Last data filed at 12/18/15 0850  Gross per 24 hour  Intake  927.5 ml  Output    325 ml  Net  602.5 ml   Filed Weights   12/17/15 2151 12/18/15 0631  Weight: 79.878 kg (176 lb 1.6 oz) 79.561 kg (175 lb 6.4 oz)    Exam: General: Alert and awake, oriented x3, not in any acute distress. HEENT: anicteric sclera, pupils reactive to light and accommodation, EOMI CVS: S1-S2 clear, no murmur rubs or gallops Chest: clear to auscultation bilaterally, no wheezing, rales or rhonchi Abdomen: soft  nontender, nondistended, normal bowel sounds, no organomegaly Extremities: no cyanosis, clubbing or edema noted bilaterally Neuro: Cranial nerves II-XII intact, no focal neurological deficits  Data Reviewed: Basic Metabolic Panel:  Recent Labs Lab 12/17/15 1556 12/18/15 0308  NA 139 139  K 4.3 3.9  CL 103 110  CO2 22 23  GLUCOSE 97 102*  BUN 15 13  CREATININE 1.51* 1.12  CALCIUM 9.5 8.2*   Liver Function Tests:  Recent Labs Lab 12/17/15 1556  AST 28  ALT 25  ALKPHOS 78  BILITOT 0.7  PROT 7.2  ALBUMIN 3.8   No results for input(s): LIPASE, AMYLASE in the last 168 hours. No results for input(s): AMMONIA in the last 168 hours. CBC:  Recent Labs Lab 12/17/15 1556 12/18/15 0308  WBC 6.5 5.5  NEUTROABS 4.9  --   HGB 15.1 13.5  HCT 44.6 41.2  MCV 85.0 87.7  PLT 313 197   Cardiac Enzymes:  Recent Labs Lab 12/17/15 2055 12/18/15 0308 12/18/15 0849  TROPONINI <0.03 <0.03 <0.03   BNP (last 3 results) No results for input(s): BNP in the last 8760 hours.  ProBNP (last 3 results) No results for input(s): PROBNP in the last 8760 hours.  CBG: No results for input(s): GLUCAP in the last 168 hours.  Micro No results found for this or any previous visit (from the past 240 hour(s)).   Studies: Dg Chest 2 View  12/17/2015  CLINICAL DATA:  Syncope. EXAM: CHEST  2 VIEW COMPARISON:  09/20/2005 FINDINGS: Normal sized heart. Clear lungs with normal vascularity. Mild thoracic spine degenerative changes. IMPRESSION: No acute abnormality. Electronically Signed   By: Claudie Revering M.D.   On: 12/17/2015 16:30  Dg Cervical Spine Complete  12/18/2015  CLINICAL DATA:  Left upper extremity numbness.  Recent falls. EXAM: CERVICAL SPINE - COMPLETE 4+ VIEW COMPARISON:  None. FINDINGS: On the lateral view the cervical spine is visualized to the level of the lower C7 endplate. There is mild straightening of the cervical spine. Pre-vertebral soft tissues are within normal limits. No  fracture is detected in the cervical spine. Dens is well positioned between the lateral masses of C1. Mild degenerative disc disease in the mid to lower cervical spine, most prominent at C4-5. There is minimal 2 mm anterolisthesis at C6-7. Mild facet arthropathy bilaterally. Mild degenerative foraminal stenosis bilaterally at C4-5. No aggressive-appearing focal osseous lesions. IMPRESSION: 1. No cervical spine fracture. 2. Mild straightening of the cervical spine, usually due to positioning and/or muscle spasm. 3. Mild degenerative disc disease in the mid to lower cervical spine, most prominent at C4-5. 4. Minimal 2 mm anterolisthesis at C6-7, probably degenerative. 5. Mild bilateral facet arthropathy. Mild degenerative foraminal stenosis bilaterally at C4-5. Electronically Signed   By: Ilona Sorrel M.D.   On: 12/18/2015 13:47    Scheduled Meds: . aspirin  325 mg Oral Daily  . enoxaparin (LOVENOX) injection  40 mg Subcutaneous Q24H  . folic acid  1 mg Oral Daily  . Influenza vac split quadrivalent PF  0.5 mL Intramuscular Tomorrow-1000  . LORazepam  0-4 mg Oral Q6H   Followed by  . [START ON 12/19/2015] LORazepam  0-4 mg Oral Q12H  . multivitamin with minerals  1 tablet Oral Daily  . nicotine  14 mg Transdermal Daily  . sodium chloride flush  3 mL Intravenous Q12H  . terbinafine   Topical Daily  . thiamine  100 mg Oral Daily   Continuous Infusions:      Time spent: 35 minutes    [facility]  [facility] Pager [phone] If 7PM-7AM, please contact night-coverage at [email or web address], password [email or web address] 12/18/2015, 2:36 PM

## 2015-12-19 ENCOUNTER — Observation Stay (HOSPITAL_BASED_OUTPATIENT_CLINIC_OR_DEPARTMENT_OTHER): Payer: MEDICAID

## 2015-12-19 DIAGNOSIS — R55 Syncope and collapse: Secondary | ICD-10-CM

## 2015-12-19 MED ORDER — LISINOPRIL-HYDROCHLOROTHIAZIDE 20-12.5 MG PO TABS: 1.0000 | ORAL_TABLET | Freq: Every day | ORAL | Status: DC

## 2015-12-19 NOTE — Discharge Summary (Signed)
Physician Discharge Summary  Jason Pacheco [id number] DOB: 26-Sep-1966 DOA: 12/17/2015  PCP: No PCP Per Patient  Admit date: 12/17/2015 Discharge date: 12/19/2015  Time spent: 40 minutes  Recommendations for Outpatient Follow-up:  1. Follow-up with primary care physician 1-2 weeks.   Discharge Diagnoses:  Principal Problem:   Syncope Active Problems:   Essential hypertension   Bilateral arm pain   Left upper extremity numbness   Discharge Condition: Stable  Diet recommendation: Heart healthy  Filed Weights   12/17/15 2151 12/18/15 0631 12/19/15 0452  Weight: 79.878 kg (176 lb 1.6 oz) 79.561 kg (175 lb 6.4 oz) 80.015 kg (176 lb 6.4 oz)    History of present illness:  Jason Pacheco is a 50 y.o. male with a history of HTN who presents with complaints of passing out this afternoon at his home. He denies any prodrome, denies any headache, chest pain or dizziness. He was with his friends who witnessed the event. He drank a total of 2 ( 40 ounce) beers today. He reports that he drinks 1-2 beers daily, and more on Saturdays with his friends. He also has complaints of having intermittent pain in both of his arms for the past week.   Hospital Course:   Syncope -Cardiac monitoring showed no evidence of arrhythmias throughout the hospital stay. -Patient denies any chest pain, any headache or any type of prodrome prior to the syncopal episode. -No orthostatic hypotension. -This is likely a combination of dehydration and alcohol intoxication. -Currently patient has no symptoms, to follow-up with primary care physician, 2-D echo is normal.  Essential hypertension -on Lisinopril/HCTz, held on admission secondary to elevated creatinine  -Restart lisinopril/HCTZ on discharge, patient follow-up with PCP.   Abnormalities on EKG- Early Repolarization and Mild LVH Changes -Mild LVH changes likely secondary to HTN, patient counseled to take his  blood pressure medications with compliance.  Bilateral arm pain -X-ray of C-Spine, complaining about some numbness every now and then. -Spine x-ray showed mild degenerative  disease most prominent at C4-5. 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 -I counseled to do less heavy lifting, do cervical spine exercises and if pain recurs by check with his PCP.  Acute renal failure -Patient presented with creatinine of 1.3,likely combination of dehydration and lisinopril/HCTZ. -This is resolved after hydration with IV fluids, creatinine is 1.1. -Counseled to keep himself hydrated especially while he is taking the lisinopril/HCTZ.    Procedures:  2-D echo done on 12/19/2015 Study Conclusions  - Left ventricle: The cavity size was normal. Systolic function was normal. The estimated ejection fraction was in the range of 55% to 60%. Wall motion was normal; there were no regional wall motion abnormalities. - Mitral valve: There was trivial regurgitation. - Left atrium: The atrium was moderately dilated. - Tricuspid valve: There was mild regurgitation. - Pulmonic valve: There was trivial regurgitation.  Consultations:  None  Discharge Exam: Filed Vitals:   12/19/15 0452 12/19/15 0900  BP: 145/92 164/98  Pulse: 57 59  Temp: 97.6 F (36.4 C) 97.6 F (36.4 C)  Resp: 16    General: Alert and awake, oriented x3, not in any acute distress. HEENT: anicteric sclera, pupils reactive to light and accommodation, EOMI CVS: S1-S2 clear, no murmur rubs or gallops Chest: clear to auscultation bilaterally, no wheezing, rales or rhonchi Abdomen: soft nontender, nondistended, normal bowel sounds, no organomegaly Extremities: no cyanosis, clubbing or edema noted bilaterally Neuro: Cranial nerves II-XII intact, no focal neurological deficits  Discharge Instructions  Current Discharge Medication List    CONTINUE these medications which have  CHANGED   Details  lisinopril-hydrochlorothiazide (ZESTORETIC) 20-12.5 MG tablet Take 1 tablet by mouth daily. Qty: 90 tablet, Refills: 3       No Known Allergies    The results of significant diagnostics from this hospitalization (including imaging, microbiology, ancillary and laboratory) are listed below for reference.    Significant Diagnostic Studies: Dg Chest 2 View  12/17/2015  CLINICAL DATA:  Syncope. EXAM: CHEST  2 VIEW COMPARISON:  09/20/2005 FINDINGS: Normal sized heart. Clear lungs with normal vascularity. Mild thoracic spine degenerative changes. IMPRESSION: No acute abnormality. Electronically Signed   By: Claudie Revering M.D.   On: 12/17/2015 16:30   Dg Cervical Spine Complete  12/18/2015  CLINICAL DATA:  Left upper extremity numbness.  Recent falls. EXAM: CERVICAL SPINE - COMPLETE 4+ VIEW COMPARISON:  None. FINDINGS: On the lateral view the cervical spine is visualized to the level of the lower C7 endplate. There is mild straightening of the cervical spine. Pre-vertebral soft tissues are within normal limits. No fracture is detected in the cervical spine. Dens is well positioned between the lateral masses of C1. Mild degenerative disc disease in the mid to lower cervical spine, most prominent at C4-5. There is minimal 2 mm anterolisthesis at C6-7. Mild facet arthropathy bilaterally. Mild degenerative foraminal stenosis bilaterally at C4-5. No aggressive-appearing focal osseous lesions. IMPRESSION: 1. No cervical spine fracture. 2. Mild straightening of the cervical spine, usually due to positioning and/or muscle spasm. 3. Mild degenerative disc disease in the mid to lower cervical spine, most prominent at C4-5. 4. Minimal 2 mm anterolisthesis at C6-7, probably degenerative. 5. Mild bilateral facet arthropathy. Mild degenerative foraminal stenosis bilaterally at C4-5. Electronically Signed   By: Ilona Sorrel M.D.   On: 12/18/2015 13:47    Microbiology: No results found for this or  any previous visit (from the past 240 hour(s)).   Labs: Basic Metabolic Panel:  Recent Labs Lab 12/17/15 1556 12/18/15 0308  NA 139 139  K 4.3 3.9  CL 103 110  CO2 22 23  GLUCOSE 97 102*  BUN 15 13  CREATININE 1.51* 1.12  CALCIUM 9.5 8.2*   Liver Function Tests:  Recent Labs Lab 12/17/15 1556  AST 28  ALT 25  ALKPHOS 78  BILITOT 0.7  PROT 7.2  ALBUMIN 3.8   No results for input(s): LIPASE, AMYLASE in the last 168 hours. No results for input(s): AMMONIA in the last 168 hours. CBC:  Recent Labs Lab 12/17/15 1556 12/18/15 0308  WBC 6.5 5.5  NEUTROABS 4.9  --   HGB 15.1 13.5  HCT 44.6 41.2  MCV 85.0 87.7  PLT 313 197   Cardiac Enzymes:  Recent Labs Lab 12/17/15 2055 12/18/15 0308 12/18/15 0849  TROPONINI <0.03 <0.03 <0.03   BNP: BNP (last 3 results) No results for input(s): BNP in the last 8760 hours.  ProBNP (last 3 results) No results for input(s): PROBNP in the last 8760 hours.  CBG: No results for input(s): GLUCAP in the last 168 hours.     Signed:  Birdie Hopes MD.  [facility] 12/19/2015, 11:44 AM

## 2015-12-19 NOTE — Care Management Note (Signed)
Case Management Note  Patient Details  Name: Jason Pacheco MRN: [id number] Date of Birth: 11-Sep-1966  Subjective/Objective:            Admitted with Syncope       Action/Plan: Patient is independent prior to admission, works part time at [employer] Patient is to call billing about his hospital bill after discharge to work out payment arrangements. No PCP, patient is agreeable to go to the [facility] for follow up care- apt to be made. CM also talked to patient about ETOH, patient stated that he has been drinking for 30 years but is ready to stop now.   Expected Discharge Date:     Possibly 12/19/2015             Expected Discharge Plan:  Home/Self Care  Discharge planning Services  CM Consult  Post Acute Care Choice:  NA  Status of Service:  Completed, signed off  Sherrilyn Rist [id number] 12/19/2015, 3:21 PM

## 2015-12-19 NOTE — Progress Notes (Signed)
  Echocardiogram 2D Echocardiogram has been performed.  Johny Chess 12/19/2015, 9:32 AM

## 2015-12-20 NOTE — Progress Notes (Signed)
1500 Discharge instructions given .Vebalized understanding. Wheeled the pt to lobby by NT

## 2015-12-20 NOTE — Progress Notes (Signed)
[facility] PROGRESS NOTE   Brandis Mcgahee [id number] DOB: 27-Feb-1966 DOA: 12/17/2015 PCP: No PCP Per Patient  HPI/Subjective: Denies any complaints today. Discharge delays for 2-D echo results, will discharge today.  Assessment/Plan: Principal Problem:   Syncope Active Problems:   Essential hypertension   Bilateral arm pain   Left upper extremity numbness  Syncope Cardiac monitoring for possible arrhythmia Check Orthostatics  Essential hypertension Hold Lisinopril/HCTz PRN IV Hydralazine  Abnormalities on EKG- Early Repolarization and Mild LVH Changes Negative cardiac enzymes waiting for echo.  Bilateral arm pain X-ray of C-Spine, complaining about some numbness every now and then. Spine x-ray showed mild disease most prominent at C4-5.  Acute renal failure Patient presented with creatinine of 1.3, likely secondary to dehydration, patient is also on lisinopril/HCTZ held. This is resolved creatinine is 1.1.  Code Status: Full Code Family Communication: Plan discussed with the patient. Disposition Plan: Remains inpatient Diet: Diet Heart Room service appropriate?: Yes; Fluid consistency:: Thin Diet - low sodium heart healthy  Consultants:  none  Procedures:  2-D echo pending  Antibiotics:  No antibiotics   Objective: Filed Vitals:   12/19/15 1943 12/20/15 0508  BP: 138/85 143/96  Pulse: 81 71  Temp: 98.4 F (36.9 C) 97.2 F (36.2 C)  Resp: 18 18    Intake/Output Summary (Last 24 hours) at 12/20/15 1135 Last data filed at 12/20/15 1034  Gross per 24 hour  Intake   1200 ml  Output   1325 ml  Net   -125 ml   Filed Weights   12/18/15 0631 12/19/15 0452 12/20/15 0508  Weight: 79.561 kg (175 lb 6.4 oz) 80.015 kg (176 lb 6.4 oz) 79.924 kg (176 lb 3.2 oz)    Exam: General: Alert and awake, oriented x3, not in any acute distress. HEENT: anicteric sclera, pupils reactive to light and accommodation,  EOMI CVS: S1-S2 clear, no murmur rubs or gallops Chest: clear to auscultation bilaterally, no wheezing, rales or rhonchi Abdomen: soft nontender, nondistended, normal bowel sounds, no organomegaly Extremities: no cyanosis, clubbing or edema noted bilaterally Neuro: Cranial nerves II-XII intact, no focal neurological deficits  Data Reviewed: Basic Metabolic Panel:  Recent Labs Lab 12/17/15 1556 12/18/15 0308  NA 139 139  K 4.3 3.9  CL 103 110  CO2 22 23  GLUCOSE 97 102*  BUN 15 13  CREATININE 1.51* 1.12  CALCIUM 9.5 8.2*   Liver Function Tests:  Recent Labs Lab 12/17/15 1556  AST 28  ALT 25  ALKPHOS 78  BILITOT 0.7  PROT 7.2  ALBUMIN 3.8   No results for input(s): LIPASE, AMYLASE in the last 168 hours. No results for input(s): AMMONIA in the last 168 hours. CBC:  Recent Labs Lab 12/17/15 1556 12/18/15 0308  WBC 6.5 5.5  NEUTROABS 4.9  --   HGB 15.1 13.5  HCT 44.6 41.2  MCV 85.0 87.7  PLT 313 197   Cardiac Enzymes:  Recent Labs Lab 12/17/15 2055 12/18/15 0308 12/18/15 0849  TROPONINI <0.03 <0.03 <0.03   BNP (last 3 results) No results for input(s): BNP in the last 8760 hours.  ProBNP (last 3 results) No results for input(s): PROBNP in the last 8760 hours.  CBG: No results for input(s): GLUCAP in the last 168 hours.  Micro No results found for this or any previous visit (from the past 240 hour(s)).   Studies: Dg Cervical Spine Complete  12/18/2015  CLINICAL DATA:  Left upper extremity numbness.  Recent falls. EXAM: CERVICAL SPINE - COMPLETE 4+ VIEW  COMPARISON:  None. FINDINGS: On the lateral view the cervical spine is visualized to the level of the lower C7 endplate. There is mild straightening of the cervical spine. Pre-vertebral soft tissues are within normal limits. No fracture is detected in the cervical spine. Dens is well positioned between the lateral masses of C1. Mild degenerative disc disease in the mid to lower cervical spine, most  prominent at C4-5. There is minimal 2 mm anterolisthesis at C6-7. Mild facet arthropathy bilaterally. Mild degenerative foraminal stenosis bilaterally at C4-5. No aggressive-appearing focal osseous lesions. IMPRESSION: 1. No cervical spine fracture. 2. Mild straightening of the cervical spine, usually due to positioning and/or muscle spasm. 3. Mild degenerative disc disease in the mid to lower cervical spine, most prominent at C4-5. 4. Minimal 2 mm anterolisthesis at C6-7, probably degenerative. 5. Mild bilateral facet arthropathy. Mild degenerative foraminal stenosis bilaterally at C4-5. Electronically Signed   By: Ilona Sorrel M.D.   On: 12/18/2015 13:47    Scheduled Meds: . aspirin  325 mg Oral Daily  . enoxaparin (LOVENOX) injection  40 mg Subcutaneous Q24H  . folic acid  1 mg Oral Daily  . LORazepam  0-4 mg Oral Q12H  . multivitamin with minerals  1 tablet Oral Daily  . nicotine  14 mg Transdermal Daily  . sodium chloride flush  3 mL Intravenous Q12H  . terbinafine   Topical Daily  . thiamine  100 mg Oral Daily   Continuous Infusions:      Time spent: 35 minutes    [facility]  [facility] Pager [phone] If 7PM-7AM, please contact night-coverage at [email or web address], password [facility] 12/20/2015, 11:35 AM

## 2015-12-30 ENCOUNTER — Ambulatory Visit: Payer: Self-pay | Attending: Family Medicine | Admitting: Physician Assistant

## 2015-12-30 VITALS — BP 119/80 | HR 81 | Temp 98.1°F | Resp 16 | Ht 69.0 in | Wt 177.0 lb

## 2015-12-30 DIAGNOSIS — F1020 Alcohol dependence, uncomplicated: Secondary | ICD-10-CM | POA: Insufficient documentation

## 2015-12-30 DIAGNOSIS — R200 Anesthesia of skin: Secondary | ICD-10-CM | POA: Insufficient documentation

## 2015-12-30 DIAGNOSIS — F17200 Nicotine dependence, unspecified, uncomplicated: Secondary | ICD-10-CM | POA: Insufficient documentation

## 2015-12-30 DIAGNOSIS — I10 Essential (primary) hypertension: Secondary | ICD-10-CM | POA: Insufficient documentation

## 2015-12-30 DIAGNOSIS — R5382 Chronic fatigue, unspecified: Secondary | ICD-10-CM | POA: Insufficient documentation

## 2015-12-30 DIAGNOSIS — R202 Paresthesia of skin: Secondary | ICD-10-CM

## 2015-12-30 DIAGNOSIS — R55 Syncope and collapse: Secondary | ICD-10-CM | POA: Insufficient documentation

## 2015-12-30 DIAGNOSIS — Z79899 Other long term (current) drug therapy: Secondary | ICD-10-CM | POA: Insufficient documentation

## 2015-12-30 MED ORDER — LISINOPRIL 40 MG PO TABS: 40.0000 mg | ORAL_TABLET | Freq: Every day | ORAL | Status: DC

## 2015-12-30 MED FILL — LISINOPRIL 40 MG TABLET: 40 | 30 days supply | Qty: 30 | Fill #0

## 2015-12-30 NOTE — Progress Notes (Signed)
Patient ID: Jason Pacheco, male   DOB: [birth year], 50 y.o.   MRN: [id number]   Jason Pacheco, is a 50 y.o. male  [id number]  [id number]  DOB - [birth year]  Chief Complaint  Patient presents with  . Hospitalization Follow-up  . Numbness  . Establish Care        Subjective:   Jason Pacheco is a 50 y.o. male here today for a follow up visit from a recent Syncopal episode and hospitalization.  He is also here to f/up on his htn.  He has noticed on the Lisinopril/HCTZ that he is having erectile dysfunction which he never had when he was only on Lisinopril.  Previously(years ago, his htn was controlled with Lisinopril only).  He has not had any further syncopal episodes, light-headedness, or dizziness. Patient has No headache, No chest pain, No abdominal pain - No Nausea, No Cough - SOB. He does c/o paresthesias in his L volar forearm at rest. He had cervical xrays at the hospital which did not show herniation.  He also c/o fatigue.  He admits to smoking and drinking to much alcohol.  His ROS is otherwise negative.   ALLERGIES: No Known Allergies  PAST MEDICAL HISTORY: Past Medical History  Diagnosis Date  . Hypertension     MEDICATIONS AT HOME: Prior to Admission medications   Medication Sig Start Date End Date Taking? Authorizing Provider  lisinopril (PRINIVIL,ZESTRIL) 40 MG tablet Take 1 tablet (40 mg total) by mouth daily. 12/30/15   Argentina Donovan, PA-C     Objective:   Filed Vitals:   12/30/15 1226  BP: 119/80  Pulse: 81  Temp: 98.1 F (36.7 C)  TempSrc: Oral  Resp: 16  Height: 5\' 9"  (1.753 m)  Weight: 177 lb (80.287 kg)  SpO2: 99%    Exam General appearance : Awake, alert, not in any distress. Speech Clear. Not toxic looking HEENT: Atraumatic and Normocephalic, pupils equally reactive to light and accomodation Neck: supple, no JVD. No cervical lymphadenopathy.  Chest:Good air entry bilaterally, no added sounds  CVS: S1 S2 regular, no murmurs.  Abdomen:  Bowel sounds present, Non tender and not distended with no gaurding, rigidity or rebound. Extremities: B/L Lower Ext shows no edema, both legs are warm to touch.  UE DTR=B.  Neg Tinel's and Phalen's Neurology: Awake alert, and oriented X 3, CN II-XII intact, Non focal Skin:No Rash  Data Review No results found for: HGBA1C   Assessment & Plan   1. Paresthesia - Vitamin B12 - Folate - TSH  2. Chronic fatigue He has started an OTC B12 a few weeks ago - Vitamin B12 - Folate - TSH  3. Essential hypertension/Erectile dysfunction since adding HCTZ.  Stop Lisinopril/HCTZ 20/12.5 - lisinopril (PRINIVIL,ZESTRIL) 40 MG tablet; Take 1 tablet (40 mg total) by mouth daily.  Dispense: 90 tablet; Refill: 3 Check BP daily or qod and record and bring in at next visit.   4.  Alcohol overuse/dependence-I gave him a list of AA meetings and counseled at length.  5. Smoking-cessation materials given and cessation encouraged.   Patient have been counseled extensively about nutrition and exercise  Return in about 2 weeks (around 01/13/2016) for establish care; f/up paresthesias.  The patient was given clear instructions to go to ER or return to medical center if symptoms don't improve, worsen or new problems develop. The patient verbalized understanding. The patient was told to call to get lab results if they haven't heard anything in the next week.  Freeman Caldron, PA-C [facility] and [place], [place]   12/30/2015, 1:18 PM

## 2015-12-30 NOTE — Patient Instructions (Signed)
Hypertension Hypertension, commonly called high blood pressure, is when the force of blood pumping through your arteries is too strong. Your arteries are the blood vessels that carry blood from your heart throughout your body. A blood pressure reading consists of a higher number over a lower number, such as 110/72. The higher number (systolic) is the pressure inside your arteries when your heart pumps. The lower number (diastolic) is the pressure inside your arteries when your heart relaxes. Ideally you want your blood pressure below 120/80. Hypertension forces your heart to work harder to pump blood. Your arteries may become narrow or stiff. Having untreated or uncontrolled hypertension can cause heart attack, stroke, kidney disease, and other problems. RISK FACTORS Some risk factors for high blood pressure are controllable. Others are not.  Risk factors you cannot control include:   Race. You may be at higher risk if you are African American.  Age. Risk increases with age.  Gender. Men are at higher risk than women before age 45 years. After age 65, women are at higher risk than men. Risk factors you can control include:  Not getting enough exercise or physical activity.  Being overweight.  Getting too much fat, sugar, calories, or salt in your diet.  Drinking too much alcohol. SIGNS AND SYMPTOMS Hypertension does not usually cause signs or symptoms. Extremely high blood pressure (hypertensive crisis) may cause headache, anxiety, shortness of breath, and nosebleed. DIAGNOSIS To check if you have hypertension, your health care provider will measure your blood pressure while you are seated, with your arm held at the level of your heart. It should be measured at least twice using the same arm. Certain conditions can cause a difference in blood pressure between your right and left arms. A blood pressure reading that is higher than normal on one occasion does not mean that you need treatment. If  it is not clear whether you have high blood pressure, you may be asked to return on a different day to have your blood pressure checked again. Or, you may be asked to monitor your blood pressure at home for 1 or more weeks. TREATMENT Treating high blood pressure includes making lifestyle changes and possibly taking medicine. Living a healthy lifestyle can help lower high blood pressure. You may need to change some of your habits. Lifestyle changes may include:  Following the DASH diet. This diet is high in fruits, vegetables, and whole grains. It is low in salt, red meat, and added sugars.  Keep your sodium intake below 2,300 mg per day.  Getting at least 30-45 minutes of aerobic exercise at least 4 times per week.  Losing weight if necessary.  Not smoking.  Limiting alcoholic beverages.  Learning ways to reduce stress. Your health care provider may prescribe medicine if lifestyle changes are not enough to get your blood pressure under control, and if one of the following is true:  You are 18-59 years of age and your systolic blood pressure is above 140.  You are 60 years of age or older, and your systolic blood pressure is above 150.  Your diastolic blood pressure is above 90.  You have diabetes, and your systolic blood pressure is over 140 or your diastolic blood pressure is over 90.  You have kidney disease and your blood pressure is above 140/90.  You have heart disease and your blood pressure is above 140/90. Your personal target blood pressure may vary depending on your medical conditions, your age, and other factors. HOME CARE INSTRUCTIONS    Have your blood pressure rechecked as directed by your health care provider.   Take medicines only as directed by your health care provider. Follow the directions carefully. Blood pressure medicines must be taken as prescribed. The medicine does not work as well when you skip doses. Skipping doses also puts you at risk for  problems.  Do not smoke.   Monitor your blood pressure at home as directed by your health care provider. SEEK MEDICAL CARE IF:   You think you are having a reaction to medicines taken.  You have recurrent headaches or feel dizzy.  You have swelling in your ankles.  You have trouble with your vision. SEEK IMMEDIATE MEDICAL CARE IF:  You develop a severe headache or confusion.  You have unusual weakness, numbness, or feel faint.  You have severe chest or abdominal pain.  You vomit repeatedly.  You have trouble breathing. MAKE SURE YOU:   Understand these instructions.  Will watch your condition.  Will get help right away if you are not doing well or get worse.   This information is not intended to replace advice given to you by your health care provider. Make sure you discuss any questions you have with your health care provider.   Document Released: 10/15/2005 Document Revised: 03/01/2015 Document Reviewed: 08/07/2013 Elsevier Interactive Patient Education 2016 [employer] Inc.  

## 2015-12-30 NOTE — Progress Notes (Signed)
Patient's here for hospital f/up for left upper extremities. Patient denies pain today.   Patient reports when sitting for an extensive period of time, his hand become numb and tingly off and on. He states numbness and pain in arm/hands are worse at night.  Patient concern with his sex drive since being on current med.  Patient is here to est.care with PCP.

## 2015-12-31 LAB — VITAMIN B12: VITAMIN B 12: 686 pg/mL (ref 200–1100)

## 2015-12-31 LAB — FOLATE: Folate: 11.9 ng/mL (ref >5.4)

## 2015-12-31 LAB — TSH: TSH: 0.6 mIU/L (ref 0.40–4.50)

## 2016-01-04 ENCOUNTER — Encounter: Payer: Self-pay | Admitting: Clinical

## 2016-01-04 NOTE — Progress Notes (Signed)
Depression screen [facility] 2/9 12/30/2015 12/30/2015  Decreased Interest 3 3  Down, Depressed, Hopeless 3 3  PHQ - 2 Score 6 6  Altered sleeping 0 -  Tired, decreased energy 3 -  Change in appetite 3 -  Feeling bad or failure about yourself  3 -  Trouble concentrating 3 -  Moving slowly or fidgety/restless 2 -  Suicidal thoughts 0 -  PHQ-9 Score 20 -    GAD 7 : Generalized Anxiety Score 12/30/2015  Nervous, Anxious, on Edge 3  Control/stop worrying 3  Worry too much - different things 3  Trouble relaxing 3  Restless 3  Easily annoyed or irritable 0  Afraid - awful might happen 3  Total GAD 7 Score 18    *Pt given AA meeting schedule and smoking cessation, per [place], PA-C

## 2016-01-06 ENCOUNTER — Telehealth: Payer: Self-pay

## 2016-01-06 NOTE — Telephone Encounter (Signed)
CMA called patient, patient verified name and DOB. Patient was given lab results, verbalized he understood with no further questions. 

## 2016-01-06 NOTE — Telephone Encounter (Signed)
-----   Message from Argentina Donovan, [place] sent at 01/05/2016  8:54 AM EST ----- Please call patient and let him know that his labs tests were normal.  The over the counter B12 and multi-vitamin he is taking are fine to continue at the current dose he is taking them.

## 2016-01-19 ENCOUNTER — Ambulatory Visit: Payer: Self-pay | Admitting: Family Medicine

## 2016-05-17 MED FILL — ?LISINOPRIL 40 MG TABLET: 40 MG | 30 days supply | Qty: 30 | Fill #1

## 2016-06-15 MED FILL — ?LISINOPRIL 40 MG TABLET: 40 MG | 30 days supply | Qty: 30 | Fill #2

## 2016-07-16 MED FILL — ?LISINOPRIL 40 MG TABLET: 40 MG | 30 days supply | Qty: 30 | Fill #3

## 2016-08-20 MED FILL — LISINOPRIL 40 MG TABLET: 40 | 30 days supply | Qty: 30 | Fill #4

## 2016-09-24 MED FILL — LISINOPRIL 40 MG TABLET: 40 | 30 days supply | Qty: 30 | Fill #5

## 2016-11-12 MED FILL — LISINOPRIL 40 MG TABLET: 40 | 30 days supply | Qty: 30 | Fill #6

## 2017-01-03 ENCOUNTER — Other Ambulatory Visit: Payer: Self-pay | Admitting: Physician Assistant

## 2017-01-03 ENCOUNTER — Telehealth: Payer: Self-pay | Admitting: General Practice

## 2017-01-03 DIAGNOSIS — I10 Essential (primary) hypertension: Secondary | ICD-10-CM

## 2017-01-03 MED ORDER — LISINOPRIL 40 MG PO TABS: 40.0000 mg | ORAL_TABLET | Freq: Every day | ORAL | 0 refills | Status: DC

## 2017-01-03 MED FILL — LISINOPRIL 40 MG TABLET: 40 | 30 days supply | Qty: 30 | Fill #0

## 2017-01-03 NOTE — Telephone Encounter (Signed)
Medication Refill: lisinopril (PRINIVIL,ZESTRIL) 40 MG tablet   scheduled appointment to establish

## 2017-01-03 NOTE — Telephone Encounter (Signed)
Lisinopril refilled.

## 2017-01-09 ENCOUNTER — Ambulatory Visit: Payer: Self-pay | Attending: Family Medicine | Admitting: Family Medicine

## 2017-01-09 DIAGNOSIS — I10 Essential (primary) hypertension: Secondary | ICD-10-CM | POA: Insufficient documentation

## 2017-01-09 LAB — BASIC METABOLIC PANEL WITH GFR
BUN: 19 mg/dL (ref 7–25)
CALCIUM: 9.6 mg/dL (ref 8.6–10.3)
CO2: 26 mmol/L (ref 20–31)
Chloride: 101 mmol/L (ref 98–110)
Creat: 1.12 mg/dL (ref 0.70–1.33)
GFR, EST NON AFRICAN AMERICAN: 76 mL/min (ref >=60)
GFR, Est African American: 87 mL/min (ref >=60)
GLUCOSE: 91 mg/dL (ref 65–99)
Potassium: 3.9 mmol/L (ref 3.5–5.3)
Sodium: 136 mmol/L (ref 135–146)

## 2017-01-09 LAB — LIPID PANEL
Cholesterol: 239 mg/dL — ABNORMAL HIGH (ref <200)
HDL: 75 mg/dL (ref >40)
LDL CALC: 145 mg/dL — AB (ref <100)
Total CHOL/HDL Ratio: 3.2 Ratio (ref <5.0)
Triglycerides: 97 mg/dL (ref <150)
VLDL: 19 mg/dL (ref <30)

## 2017-01-09 MED ORDER — LISINOPRIL 40 MG PO TABS: 40.0000 mg | ORAL_TABLET | Freq: Every day | ORAL | 2 refills | Status: DC

## 2017-01-09 MED ORDER — AMLODIPINE BESYLATE 5 MG PO TABS: 5.0000 mg | ORAL_TABLET | Freq: Every day | ORAL | 2 refills | Status: DC

## 2017-01-09 MED FILL — ?AMLODIPINE BESYLATE 5 MG T: 5 | 30 days supply | Qty: 30 | Fill #0

## 2017-01-09 NOTE — Progress Notes (Signed)
   Subjective:  Patient ID: Jason Pacheco, male    DOB: [birth year]  Age: 51 y.o. MRN: [id number]  CC: Hypertension   HPI Jason Pacheco presents for    HTN: He reports adherence with antihypertensive medication. Denies any CP, SOB, or swelling of extremities.     Outpatient Medications Prior to Visit  Medication Sig Dispense Refill  . lisinopril (PRINIVIL,ZESTRIL) 40 MG tablet Take 1 tablet (40 mg total) by mouth daily. 30 tablet 0   No facility-administered medications prior to visit.     ROS Review of Systems  Eyes: Negative.   Respiratory: Negative.   Cardiovascular: Negative.   Gastrointestinal: Negative.   Skin: Negative.   Neurological: Negative.    Objective:  BP 129/81 (BP Location: Left Arm, Patient Position: Sitting, Cuff Size: Normal)   Pulse 70   Temp 97.6 F (36.4 C) (Oral)   Resp 18   Ht 5\' 9"  (1.753 m)   Wt 182 lb 9.6 oz (82.8 kg)   BMI 26.97 kg/m   BP/Weight 01/09/2017 12/30/2015 2/42/3536  Systolic BP 144 315 400  Diastolic BP 81 80 96  Wt. (Lbs) 182.6 177 176.2  BMI 26.97 26.13 26.01   Physical Exam  Eyes: Conjunctivae are normal. Pupils are equal, round, and reactive to light.  Neck: No JVD present.  Cardiovascular: Normal rate, regular rhythm, normal heart sounds and intact distal pulses.   Pulmonary/Chest: Effort normal and breath sounds normal.  Abdominal: Soft. Bowel sounds are normal.  Skin: Skin is warm and dry.  Nursing note and vitals reviewed.  Assessment & Plan:   Problem List Items Addressed This Visit      Cardiovascular and Mediastinum   Essential hypertension   Relevant Medications   lisinopril (PRINIVIL,ZESTRIL) 40 MG tablet   amLODipine (NORVASC) 5 MG tablet   Other Relevant Orders   BASIC METABOLIC PANEL WITH GFR (Completed)   Lipid Panel (Completed)   Microalbumin/Creatinine Ratio, Urine (Completed)      Meds ordered this encounter  Medications  . lisinopril (PRINIVIL,ZESTRIL) 40 MG tablet    Sig: Take 1 tablet  (40 mg total) by mouth daily.    Dispense:  30 tablet    Refill:  2    Order Specific Question:   Supervising Provider    Answer:   Tresa Garter [id number]  . amLODipine (NORVASC) 5 MG tablet    Sig: Take 1 tablet (5 mg total) by mouth daily.    Dispense:  30 tablet    Refill:  2    Order Specific Question:   Supervising Provider    Answer:   Tresa Garter [id number]    Follow-up: Return in about 2 weeks (around 01/23/2017) for Physical and BP check .   Alfonse Spruce FNP

## 2017-01-09 NOTE — Progress Notes (Signed)
Patient is here for medication refill  Patient denies pain for today

## 2017-01-09 NOTE — Patient Instructions (Addendum)
Schedule follow up in 2 week for physical and blood pressure check.   Hypertension Hypertension, commonly called high blood pressure, is when the force of blood pumping through the arteries is too strong. The arteries are the blood vessels that carry blood from the heart throughout the body. Hypertension forces the heart to work harder to pump blood and may cause arteries to become narrow or stiff. Having untreated or uncontrolled hypertension can cause heart attacks, strokes, kidney disease, and other problems. A blood pressure reading consists of a higher number over a lower number. Ideally, your blood pressure should be below 120/80. The first ("top") number is called the systolic pressure. It is a measure of the pressure in your arteries as your heart beats. The second ("bottom") number is called the diastolic pressure. It is a measure of the pressure in your arteries as the heart relaxes. What are the causes? The cause of this condition is not known. What increases the risk? Some risk factors for high blood pressure are under your control. Others are not. Factors you can change   Smoking.  Having type 2 diabetes mellitus, high cholesterol, or both.  Not getting enough exercise or physical activity.  Being overweight.  Having too much fat, sugar, calories, or salt (sodium) in your diet.  Drinking too much alcohol. Factors that are difficult or impossible to change   Having chronic kidney disease.  Having a family history of high blood pressure.  Age. Risk increases with age.  Race. You may be at higher risk if you are African-American.  Gender. Men are at higher risk than women before age 44. After age 47, women are at higher risk than men.  Having obstructive sleep apnea.  Stress. What are the signs or symptoms? Extremely high blood pressure (hypertensive crisis) may cause:  Headache.  Anxiety.  Shortness of breath.  Nosebleed.  Nausea and vomiting.  Severe  chest pain.  Jerky movements you cannot control (seizures). How is this diagnosed? This condition is diagnosed by measuring your blood pressure while you are seated, with your arm resting on a surface. The cuff of the blood pressure monitor will be placed directly against the skin of your upper arm at the level of your heart. It should be measured at least twice using the same arm. Certain conditions can cause a difference in blood pressure between your right and left arms. Certain factors can cause blood pressure readings to be lower or higher than normal (elevated) for a short period of time:  When your blood pressure is higher when you are in a health care provider's office than when you are at home, this is called white coat hypertension. Most people with this condition do not need medicines.  When your blood pressure is higher at home than when you are in a health care provider's office, this is called masked hypertension. Most people with this condition may need medicines to control blood pressure. If you have a high blood pressure reading during one visit or you have normal blood pressure with other risk factors:  You may be asked to return on a different day to have your blood pressure checked again.  You may be asked to monitor your blood pressure at home for 1 week or longer. If you are diagnosed with hypertension, you may have other blood or imaging tests to help your health care provider understand your overall risk for other conditions. How is this treated? This condition is treated by making healthy lifestyle changes,  such as eating healthy foods, exercising more, and reducing your alcohol intake. Your health care provider may prescribe medicine if lifestyle changes are not enough to get your blood pressure under control, and if:  Your systolic blood pressure is above 130.  Your diastolic blood pressure is above 80. Your personal target blood pressure may vary depending on your  medical conditions, your age, and other factors. Follow these instructions at home: Eating and drinking   Eat a diet that is high in fiber and potassium, and low in sodium, added sugar, and fat. An example eating plan is called the DASH (Dietary Approaches to Stop Hypertension) diet. To eat this way:  Eat plenty of fresh fruits and vegetables. Try to fill half of your plate at each meal with fruits and vegetables.  Eat whole grains, such as whole wheat pasta, brown rice, or whole grain bread. Fill about one quarter of your plate with whole grains.  Eat or drink low-fat dairy products, such as skim milk or low-fat yogurt.  Avoid fatty cuts of meat, processed or cured meats, and poultry with skin. Fill about one quarter of your plate with lean proteins, such as fish, chicken without skin, beans, eggs, and tofu.  Avoid premade and processed foods. These tend to be higher in sodium, added sugar, and fat.  Reduce your daily sodium intake. Most people with hypertension should eat less than 1,500 mg of sodium a day.  Limit alcohol intake to no more than 1 drink a day for nonpregnant women and 2 drinks a day for men. One drink equals 12 oz of beer, 5 oz of wine, or 1 oz of hard liquor. Lifestyle   Work with your health care provider to maintain a healthy body weight or to lose weight. Ask what an ideal weight is for you.  Get at least 30 minutes of exercise that causes your heart to beat faster (aerobic exercise) most days of the week. Activities may include walking, swimming, or biking.  Include exercise to strengthen your muscles (resistance exercise), such as pilates or lifting weights, as part of your weekly exercise routine. Try to do these types of exercises for 30 minutes at least 3 days a week.  Do not use any products that contain nicotine or tobacco, such as cigarettes and e-cigarettes. If you need help quitting, ask your health care provider.  Monitor your blood pressure at home as  told by your health care provider.  Keep all follow-up visits as told by your health care provider. This is important. Medicines   Take over-the-counter and prescription medicines only as told by your health care provider. Follow directions carefully. Blood pressure medicines must be taken as prescribed.  Do not skip doses of blood pressure medicine. Doing this puts you at risk for problems and can make the medicine less effective.  Ask your health care provider about side effects or reactions to medicines that you should watch for. Contact a health care provider if:  You think you are having a reaction to a medicine you are taking.  You have headaches that keep coming back (recurring).  You feel dizzy.  You have swelling in your ankles.  You have trouble with your vision. Get help right away if:  You develop a severe headache or confusion.  You have unusual weakness or numbness.  You feel faint.  You have severe pain in your chest or abdomen.  You vomit repeatedly.  You have trouble breathing. Summary  Hypertension is when the  force of blood pumping through your arteries is too strong. If this condition is not controlled, it may put you at risk for serious complications.  Your personal target blood pressure may vary depending on your medical conditions, your age, and other factors. For most people, a normal blood pressure is less than 120/80.  Hypertension is treated with lifestyle changes, medicines, or a combination of both. Lifestyle changes include weight loss, eating a healthy, low-sodium diet, exercising more, and limiting alcohol. This information is not intended to replace advice given to you by your health care provider. Make sure you discuss any questions you have with your health care provider. Document Released: 10/15/2005 Document Revised: 09/12/2016 Document Reviewed: 09/12/2016 Elsevier Interactive Patient Education  2017 [employer].

## 2017-01-10 LAB — MICROALBUMIN / CREATININE URINE RATIO
Creatinine, Urine: 172 mg/dL (ref 20–370)
MICROALB UR: 1.1 mg/dL
Microalb Creat Ratio: 6 mcg/mg creat (ref <30)

## 2017-01-15 ENCOUNTER — Other Ambulatory Visit: Payer: Self-pay | Admitting: Family Medicine

## 2017-01-15 ENCOUNTER — Telehealth: Payer: Self-pay

## 2017-01-15 DIAGNOSIS — E782 Mixed hyperlipidemia: Secondary | ICD-10-CM

## 2017-01-15 MED ORDER — ASPIRIN EC 81 MG PO TBEC: 81.0000 mg | DELAYED_RELEASE_TABLET | Freq: Every day | ORAL | 2 refills | Status: DC

## 2017-01-15 MED ORDER — ROSUVASTATIN CALCIUM 20 MG PO TABS: 20.0000 mg | ORAL_TABLET | Freq: Every day | ORAL | 2 refills | Status: DC

## 2017-01-15 NOTE — Telephone Encounter (Signed)
-----   Message from Alfonse Spruce, [place] sent at 01/15/2017  4:37 PM EDT ----- Lipid levels were elevated. This can increase your risk of heart disease. You have been prescribed a crestor and aspirin to help lower your risk. Recheck lipid labs in 3 months.  Microalbumin/creatinine ratio level was normal. This tests for protein in your urine that can indicate early signs of kidney damage.  Kidney function normal

## 2017-01-15 NOTE — Telephone Encounter (Signed)
CMA call to give patient lab results  Patient did nota answer but CMA left a VM stating the results & if have any questions just to call back at the clinic

## 2017-01-16 ENCOUNTER — Other Ambulatory Visit: Payer: Self-pay | Admitting: Family Medicine

## 2017-01-16 MED FILL — ROSUVASTATIN CALCIUM 20 MG: 20 | 30 days supply | Qty: 30 | Fill #0

## 2017-01-23 ENCOUNTER — Ambulatory Visit: Payer: Self-pay | Attending: Family Medicine | Admitting: Family Medicine

## 2017-01-23 VITALS — BP 104/68 | HR 89 | Temp 98.6°F | Resp 18 | Ht 69.0 in | Wt 184.0 lb

## 2017-01-23 DIAGNOSIS — R202 Paresthesia of skin: Secondary | ICD-10-CM | POA: Insufficient documentation

## 2017-01-23 DIAGNOSIS — Z0000 Encounter for general adult medical examination without abnormal findings: Secondary | ICD-10-CM | POA: Insufficient documentation

## 2017-01-23 DIAGNOSIS — R200 Anesthesia of skin: Secondary | ICD-10-CM

## 2017-01-23 DIAGNOSIS — I10 Essential (primary) hypertension: Secondary | ICD-10-CM | POA: Insufficient documentation

## 2017-01-23 DIAGNOSIS — Z7982 Long term (current) use of aspirin: Secondary | ICD-10-CM | POA: Insufficient documentation

## 2017-01-23 MED ORDER — IBUPROFEN 600 MG PO TABS: 600.0000 mg | ORAL_TABLET | Freq: Three times a day (TID) | ORAL | 0 refills | Status: DC | PRN

## 2017-01-23 MED FILL — IBUPROFEN 600 MG TABLET: 600 | 10 days supply | Qty: 30 | Fill #0

## 2017-01-23 NOTE — Progress Notes (Signed)
Subjective:  Patient ID: Jason Pacheco, male    DOB: [birth year]  Age: 51 y.o. MRN: [id number]  CC: Hypertension and Annual Exam   HPI Jason Pacheco presents for   Annual physical exam: Denies any CP, SOB, or swelling of the BLE. History of HTN reports adherence with medications. Current everyday smoker. Smokes a pack per week.  Reports occasional alcohol use. Reports history of substance abuse counseling 1 year ago. Denies any SI/HI. History of paresthesias to left arm, with most recent episode occurring 1 week ago. Denies any history of injury, denies any swelling or temperature changes to the extremity. Denies symptoms of all other pertinent systems.    Outpatient Medications Prior to Visit  Medication Sig Dispense Refill  . amLODipine (NORVASC) 5 MG tablet Take 1 tablet (5 mg total) by mouth daily. 30 tablet 2  . aspirin EC 81 MG tablet Take 1 tablet (81 mg total) by mouth daily. 30 tablet 2  . lisinopril (PRINIVIL,ZESTRIL) 40 MG tablet Take 1 tablet (40 mg total) by mouth daily. 30 tablet 2  . rosuvastatin (CRESTOR) 20 MG tablet Take 1 tablet (20 mg total) by mouth daily. 30 tablet 2   No facility-administered medications prior to visit.     ROS Review of Systems  Constitutional: Negative.   HENT: Negative.   Eyes: Negative.   Respiratory: Negative.   Cardiovascular: Negative.   Gastrointestinal: Negative.   Endocrine: Negative.   Genitourinary: Negative.   Musculoskeletal: Negative.   Skin: Negative.   Allergic/Immunologic: Negative.   Neurological: Negative.   Hematological: Negative.   Psychiatric/Behavioral: Negative.     Objective:  BP 104/68 (BP Location: Left Arm, Patient Position: Sitting, Cuff Size: Normal)   Pulse 89   Temp 98.6 F (37 C) (Oral)   Resp 18   Ht 5' 9" (1.753 m)   Wt 184 lb (83.5 kg)   SpO2 97%   BMI 27.17 kg/m   BP/Weight 01/23/2017 2/42/6834 11/06/6220  Systolic BP 979 892 119  Diastolic BP 68 81 80  Wt. (Lbs) 184 182.6 177  BMI  27.17 26.97 26.13    Physical Exam  Constitutional: He is oriented to person, place, and time. He appears well-developed and well-nourished.  HENT:  Head: Normocephalic and atraumatic.  Right Ear: External ear normal.  Left Ear: External ear normal.  Nose: Nose normal.  Mouth/Throat: Oropharynx is clear and moist.  Eyes: Conjunctivae and EOM are normal. Pupils are equal, round, and reactive to light.  Neck: Normal range of motion. Neck supple.  Cardiovascular: Normal rate, regular rhythm, normal heart sounds and intact distal pulses.   Pulmonary/Chest: Effort normal and breath sounds normal.  Abdominal: Soft. Bowel sounds are normal.  Musculoskeletal: Normal range of motion.  Neurological: He is alert and oriented to person, place, and time. He has normal reflexes.  Skin: Skin is warm and dry.  Psychiatric: He has a normal mood and affect. His behavior is normal. Judgment and thought content normal.  Nursing note and vitals reviewed.   Assessment & Plan:   Problem List Items Addressed This Visit    None    Visit Diagnoses    Annual physical exam    -  Primary   Relevant Orders   HIV antibody (with reflex) (Completed)   CMP14+EGFR (Completed)   CBC with Differential (Completed)   TSH (Completed)   Vitamin D, 25-hydroxy (Completed)   PSA (Completed)   Healthcare maintenance       Relevant Orders   Ambulatory referral  to Gastroenterology   Numbness and tingling in left arm       Relevant Medications   ibuprofen (ADVIL,MOTRIN) 600 MG tablet   Other Relevant Orders   Ambulatory referral to Orthopedics      Meds ordered this encounter  Medications  . ibuprofen (ADVIL,MOTRIN) 600 MG tablet    Sig: Take 1 tablet (600 mg total) by mouth every 8 (eight) hours as needed for moderate pain.    Dispense:  30 tablet    Refill:  0    Order Specific Question:   Supervising Provider    Answer:   Tresa Garter [id number]    Follow-up: Return if symptoms worsen or fail to  improve. Return in about 3 months (around 04/25/2017),  for Hypertension.   Alfonse Spruce FNP

## 2017-01-23 NOTE — Progress Notes (Signed)
Patient is here for BP check & physical  Patient denies pain for today  Patient ha staking his BP medication  Patient has eaten for today

## 2017-01-23 NOTE — Patient Instructions (Signed)
You will be called with your labs results. Apply for orange card to complete referral process.  Exercising to Stay Healthy Exercising regularly is important. It has many health benefits, such as:  Improving your overall fitness, flexibility, and endurance.  Increasing your bone density.  Helping with weight control.  Decreasing your body fat.  Increasing your muscle strength.  Reducing stress and tension.  Improving your overall health. In order to become healthy and stay healthy, it is recommended that you do moderate-intensity and vigorous-intensity exercise. You can tell that you are exercising at a moderate intensity if you have a higher heart rate and faster breathing, but you are still able to hold a conversation. You can tell that you are exercising at a vigorous intensity if you are breathing much harder and faster and cannot hold a conversation while exercising. How often should I exercise? Choose an activity that you enjoy and set realistic goals. Your health care provider can help you to make an activity plan that works for you. Exercise regularly as directed by your health care provider. This may include:  Doing resistance training twice each week, such as:  Push-ups.  Sit-ups.  Lifting weights.  Using resistance bands.  Doing a given intensity of exercise for a given amount of time. Choose from these options:  150 minutes of moderate-intensity exercise every week.  75 minutes of vigorous-intensity exercise every week.  A mix of moderate-intensity and vigorous-intensity exercise every week. Children, pregnant women, people who are out of shape, people who are overweight, and older adults may need to consult a health care provider for individual recommendations. If you have any sort of medical condition, be sure to consult your health care provider before starting a new exercise program. What are some exercise ideas? Some moderate-intensity exercise ideas  include:  Walking at a rate of 1 mile in 15 minutes.  Biking.  Hiking.  Golfing.  Dancing. Some vigorous-intensity exercise ideas include:  Walking at a rate of at least 4.5 miles per hour.  Jogging or running at a rate of 5 miles per hour.  Biking at a rate of at least 10 miles per hour.  Lap swimming.  Roller-skating or in-line skating.  Cross-country skiing.  Vigorous competitive sports, such as football, basketball, and soccer.  Jumping rope.  Aerobic dancing. What are some everyday activities that can help me to get exercise?  Yard work, such as:  Psychologist, educational.  Raking and bagging leaves.  Washing and waxing your car.  Pushing a stroller.  Shoveling snow.  Gardening.  Washing windows or floors. How can I be more active in my day-to-day activities?  Use the stairs instead of the elevator.  Take a walk during your lunch break.  If you drive, park your car farther away from work or school.  If you take public transportation, get off one stop early and walk the rest of the way.  Make all of your phone calls while standing up and walking around.  Get up, stretch, and walk around every 30 minutes throughout the day. What guidelines should I follow while exercising?  Do not exercise so much that you hurt yourself, feel dizzy, or get very short of breath.  Consult your health care provider before starting a new exercise program.  Wear comfortable clothes and shoes with good support.  Drink plenty of water while you exercise to prevent dehydration or heat stroke. Body water is lost during exercise and must be replaced.  Work out  until you breathe faster and your heart beats faster. This information is not intended to replace advice given to you by your health care provider. Make sure you discuss any questions you have with your health care provider. Document Released: 11/17/2010 Document Revised: 03/22/2016 Document Reviewed:  03/18/2014 Elsevier Interactive Patient Education  2017 [employer].

## 2017-01-24 LAB — CBC WITH DIFFERENTIAL/PLATELET
BASOS: 0 %
Basophils Absolute: 0 10*3/uL (ref 0.0–0.2)
EOS (ABSOLUTE): 0.2 10*3/uL (ref 0.0–0.4)
Eos: 3 %
Hematocrit: 43.2 % (ref 37.5–51.0)
Hemoglobin: 14.1 g/dL (ref 13.0–17.7)
IMMATURE GRANS (ABS): 0 10*3/uL (ref 0.0–0.1)
Immature Granulocytes: 0 %
LYMPHS ABS: 2.1 10*3/uL (ref 0.7–3.1)
LYMPHS: 39 %
MCH: 27.6 pg (ref 26.6–33.0)
MCHC: 32.6 g/dL (ref 31.5–35.7)
MCV: 85 fL (ref 79–97)
MONOS ABS: 0.4 10*3/uL (ref 0.1–0.9)
Monocytes: 8 %
NEUTROS ABS: 2.6 10*3/uL (ref 1.4–7.0)
Neutrophils: 50 %
PLATELETS: 236 10*3/uL (ref 150–379)
RBC: 5.11 x10E6/uL (ref 4.14–5.80)
RDW: 13.6 % (ref 12.3–15.4)
WBC: 5.3 10*3/uL (ref 3.4–10.8)

## 2017-01-24 LAB — CMP14+EGFR
A/G RATIO: 1.6 (ref 1.2–2.2)
ALBUMIN: 4.5 g/dL (ref 3.5–5.5)
ALT: 29 IU/L (ref 0–44)
AST: 27 IU/L (ref 0–40)
Alkaline Phosphatase: 61 IU/L (ref 39–117)
BUN / CREAT RATIO: 20 (ref 9–20)
BUN: 20 mg/dL (ref 6–24)
Bilirubin Total: 0.6 mg/dL (ref 0.0–1.2)
CALCIUM: 8.7 mg/dL (ref 8.7–10.2)
CO2: 19 mmol/L (ref 18–29)
Chloride: 100 mmol/L (ref 96–106)
Creatinine, Ser: 1.02 mg/dL (ref 0.76–1.27)
GFR, EST AFRICAN AMERICAN: 98 mL/min/{1.73_m2} (ref >59)
GFR, EST NON AFRICAN AMERICAN: 85 mL/min/{1.73_m2} (ref >59)
GLOBULIN, TOTAL: 2.8 g/dL (ref 1.5–4.5)
Glucose: 76 mg/dL (ref 65–99)
POTASSIUM: 4.4 mmol/L (ref 3.5–5.2)
SODIUM: 137 mmol/L (ref 134–144)
TOTAL PROTEIN: 7.3 g/dL (ref 6.0–8.5)

## 2017-01-24 LAB — HIV ANTIBODY (ROUTINE TESTING W REFLEX): HIV Screen 4th Generation wRfx: NONREACTIVE

## 2017-01-24 LAB — VITAMIN D 25 HYDROXY (VIT D DEFICIENCY, FRACTURES): VIT D 25 HYDROXY: 15.3 ng/mL — AB (ref 30.0–100.0)

## 2017-01-24 LAB — PSA: PROSTATE SPECIFIC AG, SERUM: 0.8 ng/mL (ref 0.0–4.0)

## 2017-01-24 LAB — TSH: TSH: 0.711 u[IU]/mL (ref 0.450–4.500)

## 2017-01-28 ENCOUNTER — Telehealth: Payer: Self-pay

## 2017-01-28 ENCOUNTER — Other Ambulatory Visit: Payer: Self-pay | Admitting: Family Medicine

## 2017-01-28 DIAGNOSIS — E559 Vitamin D deficiency, unspecified: Secondary | ICD-10-CM

## 2017-01-28 MED ORDER — VITAMIN D (ERGOCALCIFEROL) 1.25 MG (50000 UNIT) PO CAPS
50000.0000 [IU] | ORAL_CAPSULE | ORAL | 0 refills | Status: AC
Start: 2017-01-28 — End: 2017-03-19

## 2017-01-28 MED FILL — VIT D2 1.25 MG (50,000 UNIT: 1.25 MG | 56 days supply | Qty: 8 | Fill #0

## 2017-01-28 NOTE — Telephone Encounter (Signed)
-----   Message from Alfonse Spruce, [place] sent at 01/28/2017  1:07 PM EDT ----- HIV is negative.  Vitamin D level was low. Vitamin D helps to keep bones strong. You were prescribed ergocalciferol (capsules) to increase your vitamin-d level. Once finished start taking OTC vitamin d supplement with 800 international units (IU) of vitamin-d per day. Recheck levels in 3 months.  Kidney function normal Thyroid function normal Liver function normal PSA is normal. PSA is screens for prostate problems

## 2017-01-28 NOTE — Telephone Encounter (Signed)
CMA call to inform patient about lab results  Patient did not answer but CMA left a detailed message & if have any question about results just to call back

## 2017-01-29 ENCOUNTER — Encounter: Payer: Self-pay | Admitting: Family Medicine

## 2017-02-08 MED FILL — ROSUVASTATIN CALCIUM 20 MG: 20 | 30 days supply | Qty: 30 | Fill #1

## 2017-02-08 MED FILL — ?AMLODIPINE BESYLATE 5 MG T: 5 | 30 days supply | Qty: 30 | Fill #1

## 2017-02-08 MED FILL — LISINOPRIL 40 MG TABLET: 40 | 30 days supply | Qty: 30 | Fill #0

## 2017-03-11 ENCOUNTER — Telehealth: Payer: Self-pay | Admitting: Family Medicine

## 2017-03-11 DIAGNOSIS — I10 Essential (primary) hypertension: Secondary | ICD-10-CM

## 2017-03-11 DIAGNOSIS — E782 Mixed hyperlipidemia: Secondary | ICD-10-CM

## 2017-03-11 MED ORDER — ROSUVASTATIN CALCIUM 20 MG PO TABS: 20.0000 mg | ORAL_TABLET | Freq: Every day | ORAL | 2 refills | Status: DC

## 2017-03-11 MED ORDER — AMLODIPINE BESYLATE 5 MG PO TABS: 5.0000 mg | ORAL_TABLET | Freq: Every day | ORAL | 2 refills | Status: DC

## 2017-03-11 MED ORDER — ASPIRIN EC 81 MG PO TBEC: 81.0000 mg | DELAYED_RELEASE_TABLET | Freq: Every day | ORAL | 2 refills | Status: AC

## 2017-03-11 MED ORDER — LISINOPRIL 40 MG PO TABS: 40.0000 mg | ORAL_TABLET | Freq: Every day | ORAL | 2 refills | Status: DC

## 2017-03-11 MED FILL — LISINOPRIL 40 MG TABLET: 40 | 30 days supply | Qty: 30 | Fill #0

## 2017-03-11 MED FILL — ?ROSUVASTATIN CALCIUM 20 MG: 20 MG | 30 days supply | Qty: 30 | Fill #0

## 2017-03-11 MED FILL — AMLODIPINE BESYLATE 5 MG TA: 5 | 30 days supply | Qty: 30 | Fill #0

## 2017-03-11 NOTE — Telephone Encounter (Signed)
Patient came to the office to request medication refill on all his medications. Please send them to our pharmacy.  Thank you.

## 2017-03-11 NOTE — Telephone Encounter (Signed)
Requested medications refilled 

## 2017-04-18 MED FILL — ROSUVASTATIN CALCIUM 20 MG: 20 | 30 days supply | Qty: 30 | Fill #1

## 2017-04-18 MED FILL — AMLODIPINE BESYLATE 5 MG TA: 5 | 30 days supply | Qty: 30 | Fill #2

## 2017-04-18 MED FILL — LISINOPRIL 40 MG TABLET: 40 | 30 days supply | Qty: 30 | Fill #1

## 2017-05-14 ENCOUNTER — Other Ambulatory Visit: Payer: Self-pay | Admitting: Family Medicine

## 2017-05-14 DIAGNOSIS — E782 Mixed hyperlipidemia: Secondary | ICD-10-CM

## 2017-05-14 DIAGNOSIS — I10 Essential (primary) hypertension: Secondary | ICD-10-CM

## 2017-05-14 NOTE — Telephone Encounter (Signed)
Refilled requested medications x 30 days, needs office visit.

## 2017-05-29 MED FILL — ROSUVASTATIN CALCIUM 20 MG: 20 | 30 days supply | Qty: 30 | Fill #2

## 2017-05-29 MED FILL — LISINOPRIL 40 MG TABLET: 40 | 30 days supply | Qty: 30 | Fill #2

## 2017-05-29 MED FILL — AMLODIPINE BESYLATE 5 MG TA: 5 | 30 days supply | Qty: 30 | Fill #1

## 2017-06-20 IMAGING — DX DG CHEST 2V
2 series · 2 of 2 positions shown · non-contrast
Comparison: 09/20/2005

CLINICAL DATA: Syncope.

EXAM:
CHEST  2 VIEW

[w chest pa]
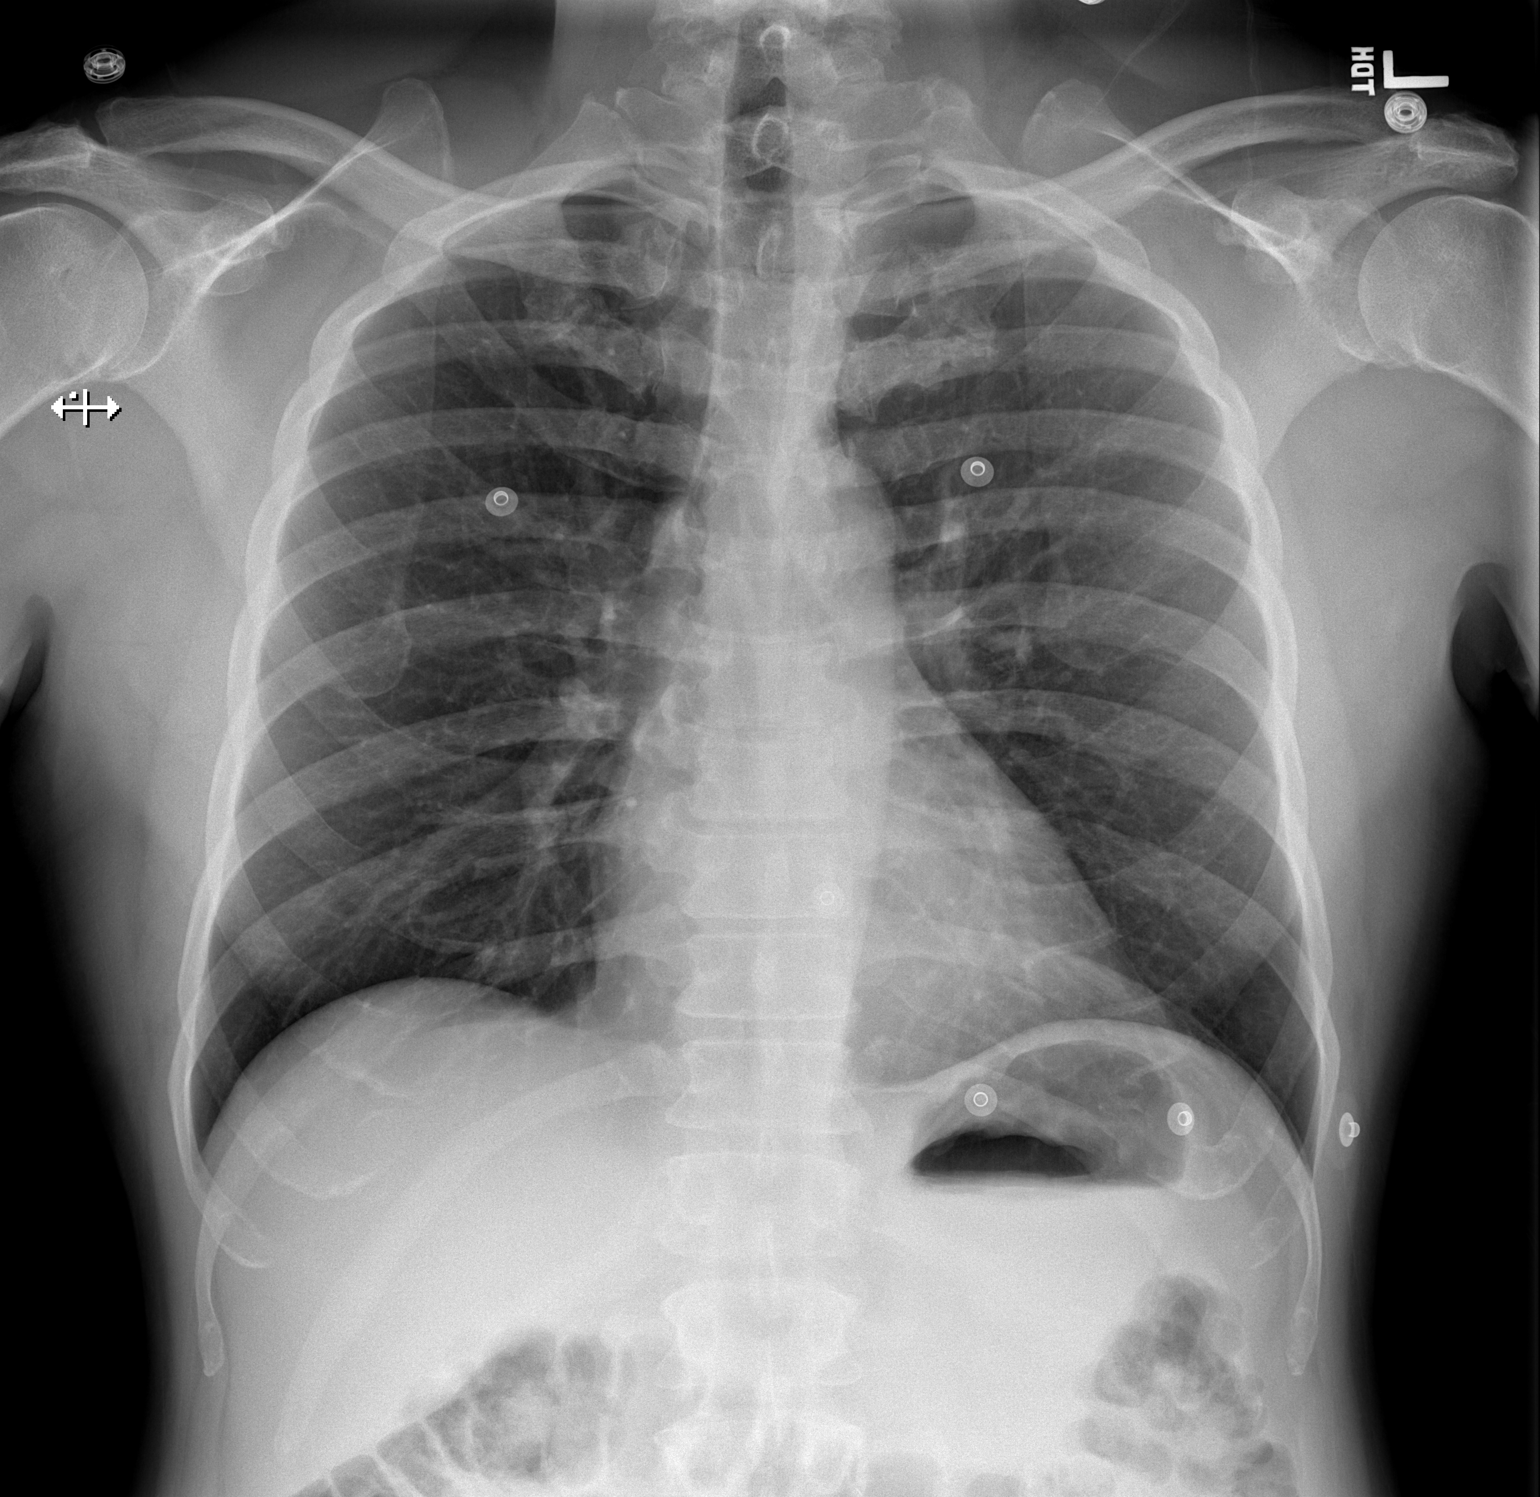

[w chest lat]
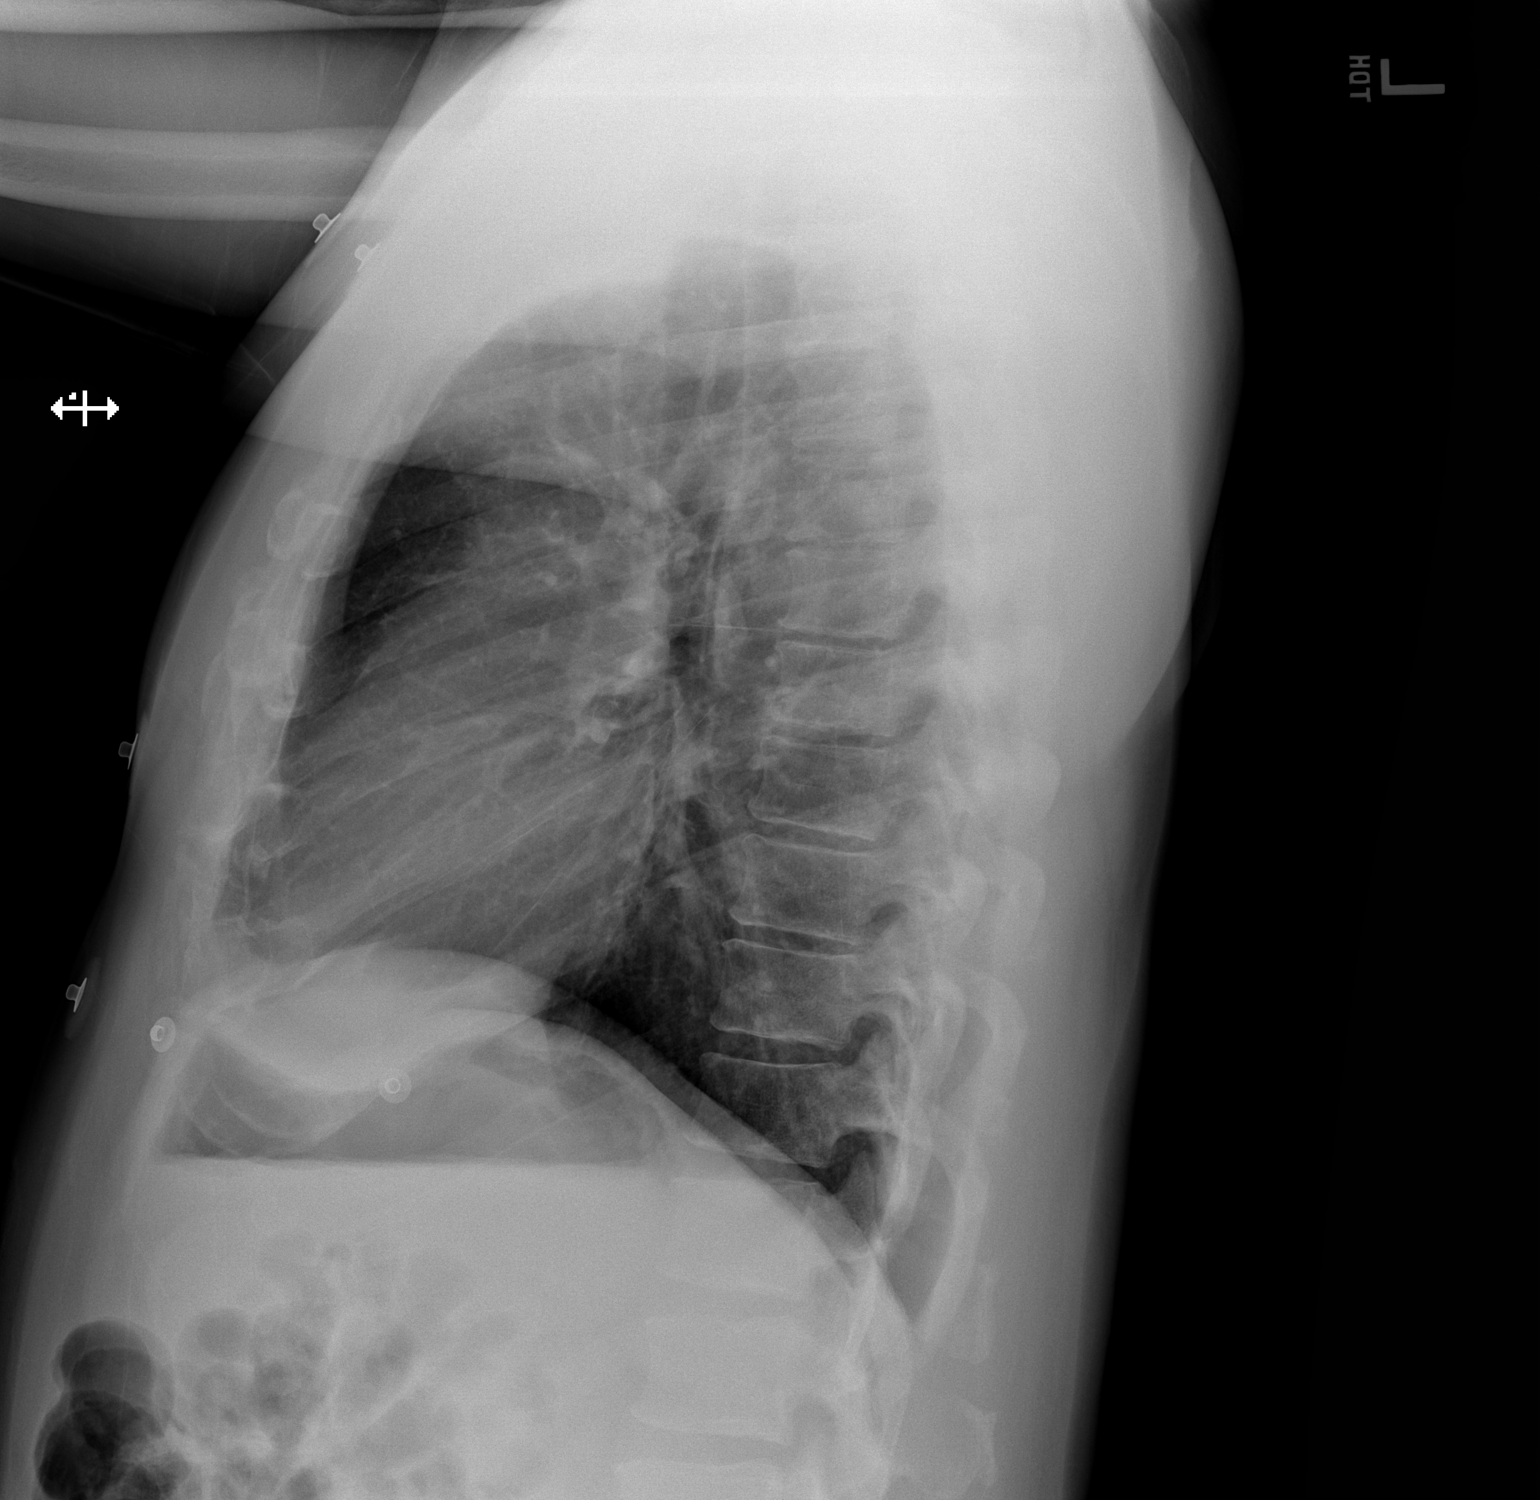

[2 of 2 positions shown; findings below may reference images not displayed]

FINDINGS: Normal sized heart. Clear lungs with normal vascularity. Mild
thoracic spine degenerative changes.
IMPRESSION: No acute abnormality.

## 2017-06-21 IMAGING — DX DG CERVICAL SPINE COMPLETE 4+V
5 series · 5 of 5 positions shown · non-contrast
Comparison: None.

CLINICAL DATA: Left upper extremity numbness.  Recent falls.

EXAM:
CERVICAL SPINE - COMPLETE 4+ VIEW

[c-spine lat]
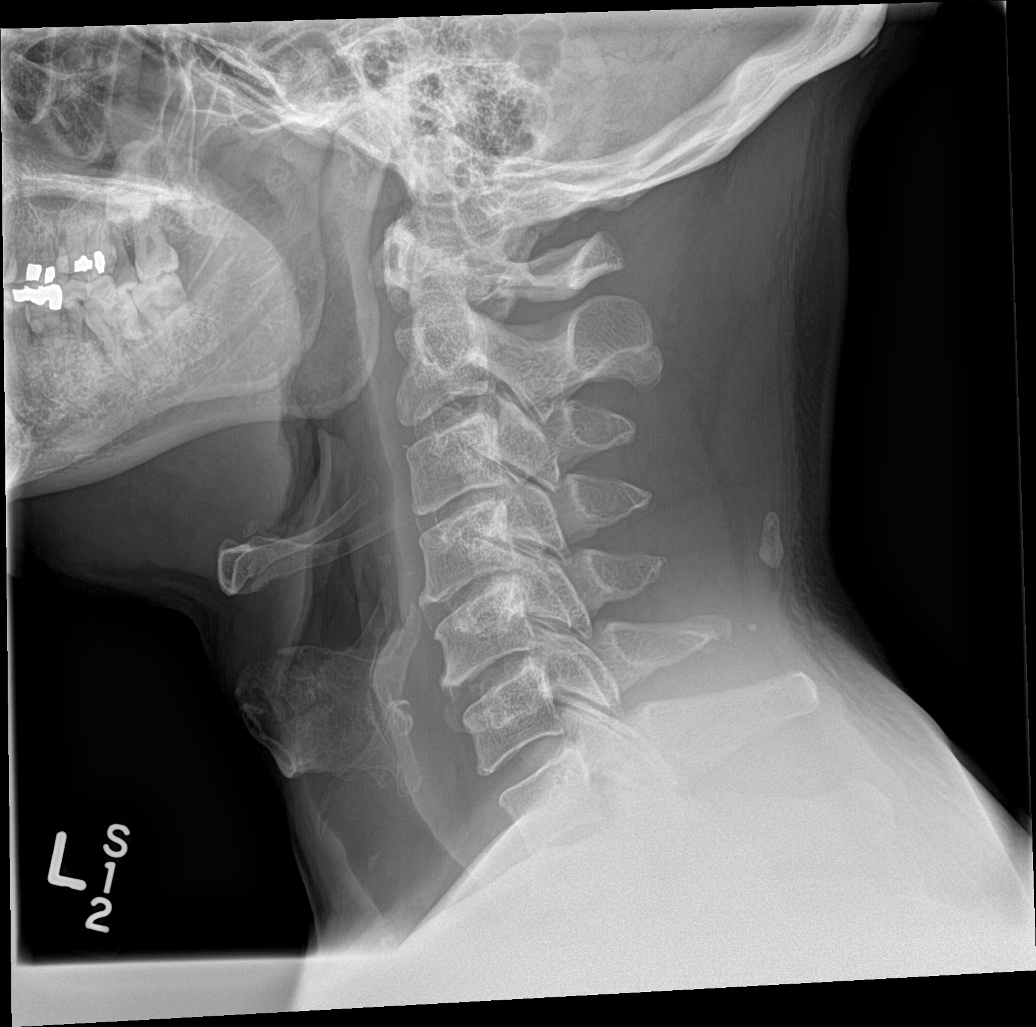

[c-spine obl (1 of 2)]
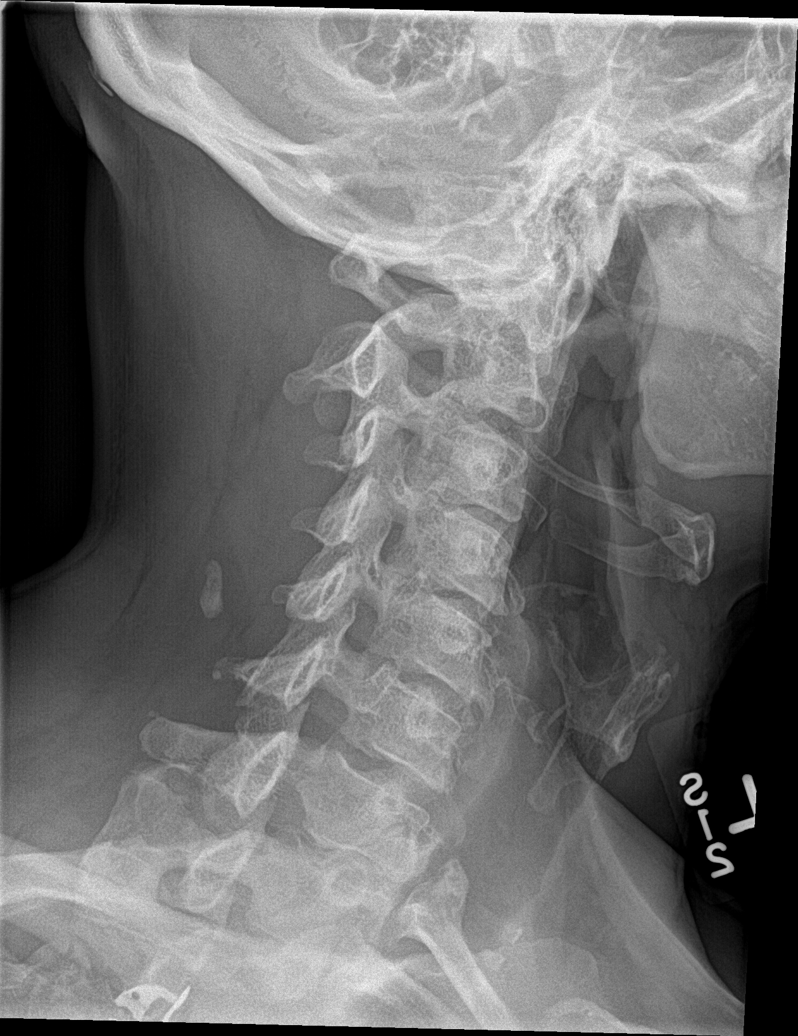

[c-spine obl (2 of 2)]
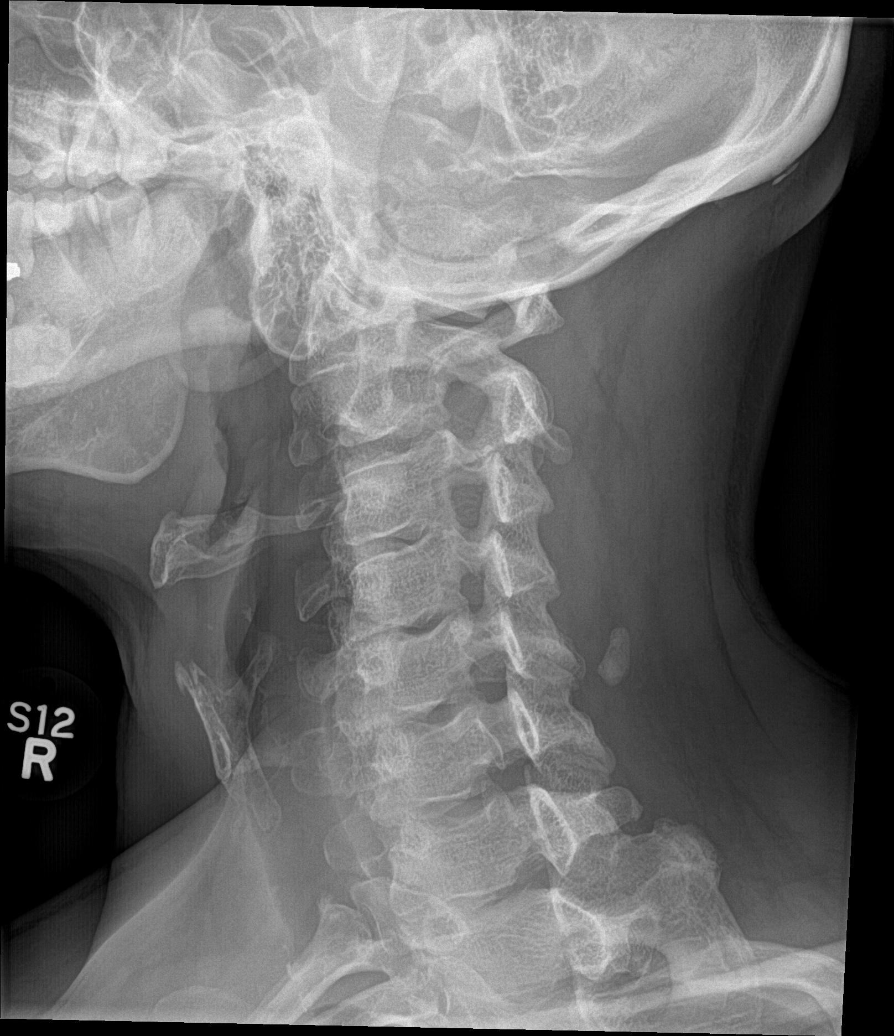

[c-spine ap]
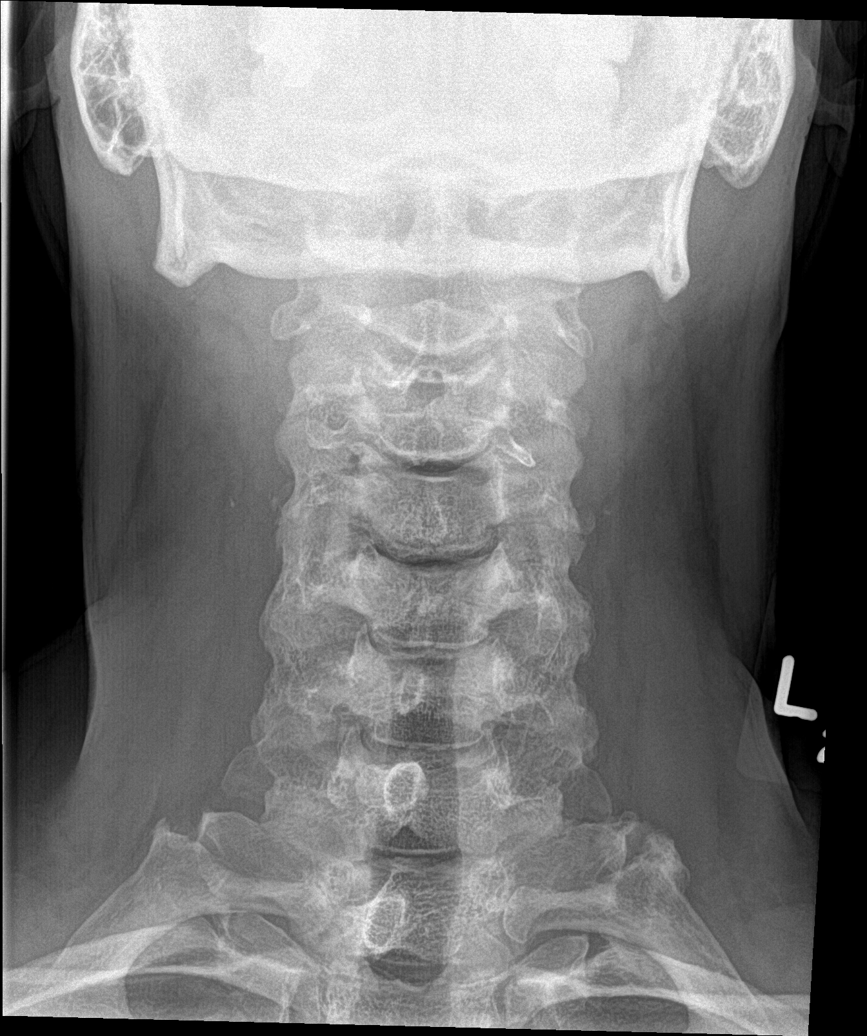

[c-spine open mouth]
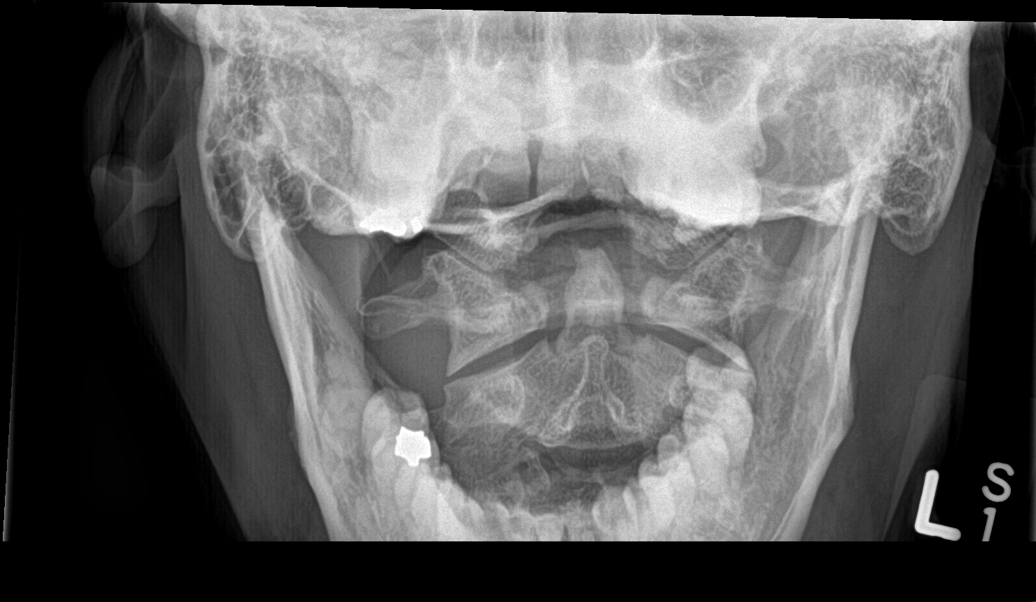

[5 of 5 positions shown; findings below may reference images not displayed]

FINDINGS: On the lateral view the cervical spine is visualized to the level of
the lower C7 endplate. There is mild straightening of the cervical
spine. Pre-vertebral soft tissues are within normal limits. No
fracture is detected in the cervical spine. Dens is well positioned
between the lateral masses of C1. Mild degenerative disc disease in
the mid to lower cervical spine, most prominent at C4-5. There is
minimal 2 mm anterolisthesis at C6-7. Mild facet arthropathy
bilaterally. Mild degenerative foraminal stenosis bilaterally at
C4-5. No aggressive-appearing focal osseous lesions.
IMPRESSION: 1. No cervical spine fracture.
2. Mild straightening of the cervical spine, usually due to
positioning and/or muscle spasm.
3. Mild degenerative disc disease in the mid to lower cervical
spine, most prominent at C4-5.
4. Minimal 2 mm anterolisthesis at C6-7, probably degenerative.
5. Mild bilateral facet arthropathy. Mild degenerative foraminal
stenosis bilaterally at C4-5.

## 2017-06-24 ENCOUNTER — Other Ambulatory Visit: Payer: Self-pay | Admitting: Family Medicine

## 2017-06-24 DIAGNOSIS — I10 Essential (primary) hypertension: Secondary | ICD-10-CM

## 2017-07-22 ENCOUNTER — Other Ambulatory Visit: Payer: Self-pay | Admitting: Family Medicine

## 2017-07-22 DIAGNOSIS — I10 Essential (primary) hypertension: Secondary | ICD-10-CM

## 2017-07-22 MED FILL — AMLODIPINE BESYLATE 5 MG TA: 5 | 30 days supply | Qty: 30 | Fill #2

## 2017-07-24 ENCOUNTER — Other Ambulatory Visit: Payer: Self-pay | Admitting: Family Medicine

## 2017-07-24 DIAGNOSIS — E782 Mixed hyperlipidemia: Secondary | ICD-10-CM

## 2017-08-06 MED FILL — ROSUVASTATIN CALCIUM 20 MG: 20 | 30 days supply | Qty: 30 | Fill #0

## 2017-08-06 MED FILL — LISINOPRIL 40 MG TABLET: 40 | 30 days supply | Qty: 30 | Fill #1

## 2017-08-19 ENCOUNTER — Other Ambulatory Visit: Payer: Self-pay | Admitting: Family Medicine

## 2017-08-19 DIAGNOSIS — I10 Essential (primary) hypertension: Secondary | ICD-10-CM

## 2017-09-02 MED FILL — ROSUVASTATIN CALCIUM 20 MG: 20 | 30 days supply | Qty: 30 | Fill #1

## 2017-09-02 MED FILL — LISINOPRIL 40 MG TABLET: 40 | 30 days supply | Qty: 30 | Fill #2

## 2017-09-02 MED FILL — AMLODIPINE BESYLATE 5 MG TA: 5 | 30 days supply | Qty: 30 | Fill #0

## 2017-09-30 ENCOUNTER — Other Ambulatory Visit: Payer: Self-pay | Admitting: Family Medicine

## 2017-09-30 DIAGNOSIS — I10 Essential (primary) hypertension: Secondary | ICD-10-CM

## 2017-10-04 ENCOUNTER — Other Ambulatory Visit: Payer: Self-pay | Admitting: Family Medicine

## 2017-10-04 DIAGNOSIS — I10 Essential (primary) hypertension: Secondary | ICD-10-CM

## 2017-10-04 MED FILL — AMLODIPINE BESYLATE 5 MG TA: 5 | 30 days supply | Qty: 30 | Fill #0

## 2017-10-04 MED FILL — LISINOPRIL 40 MG TAB: 40 | 30 days supply | Qty: 30 | Fill #0

## 2017-10-30 ENCOUNTER — Other Ambulatory Visit: Payer: Self-pay | Admitting: Family Medicine

## 2017-10-30 DIAGNOSIS — I10 Essential (primary) hypertension: Secondary | ICD-10-CM

## 2017-11-08 ENCOUNTER — Ambulatory Visit: Payer: Self-pay | Attending: Family Medicine

## 2017-11-08 ENCOUNTER — Other Ambulatory Visit: Payer: Self-pay | Admitting: Family Medicine

## 2017-11-08 DIAGNOSIS — I10 Essential (primary) hypertension: Secondary | ICD-10-CM

## 2017-11-08 MED FILL — ROSUVASTATIN CALCIUM 20 MG: 20 | 30 days supply | Qty: 30 | Fill #2

## 2017-11-08 MED FILL — LISINOPRIL 40 MG TAB: 40 | 30 days supply | Qty: 30 | Fill #1

## 2017-11-27 ENCOUNTER — Other Ambulatory Visit: Payer: Self-pay | Admitting: Family Medicine

## 2017-11-27 DIAGNOSIS — E782 Mixed hyperlipidemia: Secondary | ICD-10-CM

## 2017-11-27 DIAGNOSIS — I10 Essential (primary) hypertension: Secondary | ICD-10-CM

## 2017-12-03 ENCOUNTER — Other Ambulatory Visit: Payer: Self-pay

## 2017-12-03 ENCOUNTER — Ambulatory Visit: Payer: Self-pay | Attending: Family Medicine | Admitting: Family Medicine

## 2017-12-03 VITALS — BP 128/78 | HR 77 | Temp 98.0°F | Resp 18 | Wt 190.0 lb

## 2017-12-03 DIAGNOSIS — Z131 Encounter for screening for diabetes mellitus: Secondary | ICD-10-CM

## 2017-12-03 DIAGNOSIS — R202 Paresthesia of skin: Secondary | ICD-10-CM | POA: Insufficient documentation

## 2017-12-03 DIAGNOSIS — M79609 Pain in unspecified limb: Secondary | ICD-10-CM | POA: Insufficient documentation

## 2017-12-03 DIAGNOSIS — F17210 Nicotine dependence, cigarettes, uncomplicated: Secondary | ICD-10-CM | POA: Insufficient documentation

## 2017-12-03 DIAGNOSIS — I10 Essential (primary) hypertension: Secondary | ICD-10-CM | POA: Insufficient documentation

## 2017-12-03 DIAGNOSIS — Z1211 Encounter for screening for malignant neoplasm of colon: Secondary | ICD-10-CM

## 2017-12-03 DIAGNOSIS — Z125 Encounter for screening for malignant neoplasm of prostate: Secondary | ICD-10-CM

## 2017-12-03 DIAGNOSIS — Z7982 Long term (current) use of aspirin: Secondary | ICD-10-CM | POA: Insufficient documentation

## 2017-12-03 DIAGNOSIS — Z79899 Other long term (current) drug therapy: Secondary | ICD-10-CM | POA: Insufficient documentation

## 2017-12-03 DIAGNOSIS — Z0000 Encounter for general adult medical examination without abnormal findings: Secondary | ICD-10-CM | POA: Insufficient documentation

## 2017-12-03 LAB — HEMOCCULT GUIAC POC 1CARD (OFFICE): FECAL OCCULT BLD: NEGATIVE

## 2017-12-03 MED ORDER — NAPROXEN 500 MG PO TABS: 500.0000 mg | ORAL_TABLET | Freq: Two times a day (BID) | ORAL | 0 refills | Status: DC | PRN

## 2017-12-03 MED ORDER — AMLODIPINE BESYLATE 5 MG PO TABS: 5.0000 mg | ORAL_TABLET | Freq: Every day | ORAL | 5 refills | Status: DC

## 2017-12-03 MED ORDER — LISINOPRIL 40 MG PO TABS: 40.0000 mg | ORAL_TABLET | Freq: Every day | ORAL | 5 refills | Status: DC

## 2017-12-03 MED FILL — ROSUVASTATIN CALCIUM 20 MG: 20 | 30 days supply | Qty: 30 | Fill #2

## 2017-12-03 MED FILL — LISINOPRIL 40 MG TAB: 40 | 30 days supply | Qty: 30 | Fill #2

## 2017-12-03 NOTE — Progress Notes (Signed)
Yearly physical  No pain today  Stated been having numbness on rt elbow running to hands,1st 2nd and 3rd rt finger with pain to the touch

## 2017-12-03 NOTE — Progress Notes (Signed)
Subjective:   Patient ID: Jason Pacheco, male    DOB: June 04, 1966, 52 y.o.   MRN: [id number]  Chief Complaint  Patient presents with  . Annual Exam   HPI Jason Pacheco 52 y.o. male presents for comprehensive physical examination. FH of htn- mother and father ,dm-no, cancers- mother (leukemia). He denies any CP, SOB, or swelling of the BLE. History of HTN reports adherence with medications.He is a former smoker.  Reports occasional alcohol use. Reports history of substance abuse counseling a few  years ago. He denies any illicit drug use. Denies any SI/HI. History of chronic paresthesias to left arm,symptoms worst at night. Denies any history of injury, denies any swelling or temperature changes to the extremity. Denies symptoms of all other pertinent systems.  Past Medical History:  Diagnosis Date  . Hypertension     No past surgical history on file.  Family History  Problem Relation Age of Onset  . Hypertension Mother   . Heart disease Mother     Social History   Socioeconomic History  . Marital status: Single    Spouse name: Not on file  . Number of children: Not on file  . Years of education: Not on file  . Highest education level: Not on file  Social Needs  . Financial resource strain: Not on file  . Food insecurity - worry: Not on file  . Food insecurity - inability: Not on file  . Transportation needs - medical: Not on file  . Transportation needs - non-medical: Not on file  Occupational History  . Not on file  Tobacco Use  . Smoking status: Current Every Day Smoker    Packs/day: 1.00    Types: Cigarettes  Substance and Sexual Activity  . Alcohol use: Yes    Comment: 2 40 oz  . Drug use: No  . Sexual activity: Not on file  Other Topics Concern  . Not on file  Social History Narrative  . Not on file    Outpatient Medications Prior to Visit  Medication Sig Dispense Refill  . rosuvastatin (CRESTOR) 20 MG tablet TAKE 1 TABLET (20 MG TOTAL) BY MOUTH DAILY. 30  tablet 2  . amLODipine (NORVASC) 5 MG tablet TAKE 1 TABLET BY MOUTH DAILY. 30 tablet 0  . lisinopril (PRINIVIL,ZESTRIL) 40 MG tablet TAKE 1 TABLET (40 MG TOTAL) BY MOUTH DAILY. 30 tablet 2  . aspirin EC 81 MG tablet Take 1 tablet (81 mg total) by mouth daily. (Patient not taking: Reported on 12/03/2017) 30 tablet 2  . ibuprofen (ADVIL,MOTRIN) 600 MG tablet Take 1 tablet (600 mg total) by mouth every 8 (eight) hours as needed for moderate pain. (Patient not taking: Reported on 12/03/2017) 30 tablet 0   No facility-administered medications prior to visit.     No Known Allergies  Review of Systems  Constitutional: Negative.   HENT: Negative.   Eyes: Negative.   Respiratory: Negative.   Cardiovascular: Negative.   Gastrointestinal: Negative.   Genitourinary: Negative.   Musculoskeletal: Negative.   Skin: Negative.   Neurological: Positive for tingling.  Endo/Heme/Allergies: Negative.   Psychiatric/Behavioral: Negative.        Objective:    Physical Exam  Constitutional: He is oriented to person, place, and time. He appears well-developed and well-nourished.  HENT:  Head: Normocephalic and atraumatic.  Right Ear: External ear normal.  Left Ear: External ear normal.  Nose: Nose normal.  Mouth/Throat: Oropharynx is clear and moist.  Eyes: Conjunctivae and EOM are normal. Pupils are equal,  round, and reactive to light.  Neck: Normal range of motion. Neck supple. No JVD present.  Cardiovascular: Normal rate, regular rhythm, normal heart sounds and intact distal pulses.  Pulmonary/Chest: Effort normal and breath sounds normal.  Abdominal: Soft. Bowel sounds are normal. There is no tenderness.  Genitourinary: Rectum normal, prostate normal and penis normal. Rectal exam shows guaiac negative stool.  Musculoskeletal: Normal range of motion.       Right hand: Normal. Normal sensation noted. Normal strength noted.       Left hand: Normal. Normal sensation noted. Normal strength noted.    Lymphadenopathy:    He has no cervical adenopathy.  Neurological: He is alert and oriented to person, place, and time. He has normal reflexes.  Skin: Skin is warm and dry.  Psychiatric: He has a normal mood and affect.  Nursing note and vitals reviewed.   BP 128/78 (BP Location: Left Arm, Patient Position: Sitting, Cuff Size: Large)   Pulse 77   Temp 98 F (36.7 C) (Oral)   Resp 18   Wt 190 lb (86.2 kg)   SpO2 97%   BMI 28.06 kg/m  Wt Readings from Last 3 Encounters:  12/03/17 190 lb (86.2 kg)  01/23/17 184 lb (83.5 kg)  01/09/17 182 lb 9.6 oz (82.8 kg)    Immunization History  Administered Date(s) Administered  . Influenza,inj,Quad PF,6+ Mos 12/19/2015    Lab Results  Component Value Date   TSH 1.110 12/03/2017   Lab Results  Component Value Date   WBC 5.3 01/23/2017   HGB 14.1 01/23/2017   HCT 43.2 01/23/2017   MCV 85 01/23/2017   PLT 236 01/23/2017   Lab Results  Component Value Date   NA 143 12/03/2017   K 4.1 12/03/2017   CO2 19 (L) 12/03/2017   GLUCOSE 82 12/03/2017   BUN 14 12/03/2017   CREATININE 1.09 12/03/2017   BILITOT 0.8 12/03/2017   ALKPHOS 55 12/03/2017   AST 25 12/03/2017   ALT 25 12/03/2017   PROT 7.7 12/03/2017   ALBUMIN 4.8 12/03/2017   CALCIUM 9.4 12/03/2017   ANIONGAP 6 12/18/2015   Lab Results  Component Value Date   CHOL 180 12/03/2017   CHOL 239 (H) 01/09/2017   Lab Results  Component Value Date   HDL 81 12/03/2017   HDL 75 01/09/2017   Lab Results  Component Value Date   LDLCALC 71 12/03/2017   LDLCALC 145 (H) 01/09/2017   Lab Results  Component Value Date   TRIG 139 12/03/2017   TRIG 97 01/09/2017   Lab Results  Component Value Date   CHOLHDL 2.2 12/03/2017   CHOLHDL 3.2 01/09/2017   No results found for: HGBA1C     Assessment & Plan:   1. Annual physical exam   - CMP and Liver - POCT glycosylated hemoglobin (Hb A1C) - Lipid Panel - Vitamin D, 25-hydroxy - TSH - PSA  2. Screen for colon  cancer  - Hemoccult - 1 Card (office) - Ambulatory referral to Gastroenterology  3. Screening for prostate cancer -CMA present for exam   4. Paresthesia and pain of extremity  - Ambulatory referral to Orthopedics - naproxen (NAPROSYN) 500 MG tablet; Take 1 tablet (500 mg total) by mouth every 12 (twelve) hours as needed for mild pain or moderate pain. Take with food.  Dispense: 40 tablet; Refill: 0  5. Essential hypertension  - amLODipine (NORVASC) 5 MG tablet; Take 1 tablet (5 mg total) by mouth daily.  Dispense: 30 tablet;  Refill: 5 - lisinopril (PRINIVIL,ZESTRIL) 40 MG tablet; Take 1 tablet (40 mg total) by mouth daily.  Dispense: 30 tablet; Refill: 5     Follow up: Return in about 6 months (around 06/02/2018) for HTN .   Fredia Beets, FNP

## 2017-12-03 NOTE — Patient Instructions (Addendum)
To pharmacy for tetanus vaccine    Paresthesia Paresthesia is a burning or prickling feeling. This feeling can happen in any part of the body. It often happens in the hands, arms, legs, or feet. Usually, it is not painful. In most cases, the feeling goes away in a short time and is not a sign of a serious problem. Follow these instructions at home:  Avoid drinking alcohol.  Try massage or needle therapy (acupuncture) to help with your problems.  Keep all follow-up visits as told by your doctor. This is important. Contact a doctor if:  You keep on having episodes of paresthesia.  Your burning or prickling feeling gets worse when you walk.  You have pain or cramps.  You feel dizzy.  You have a rash. Get help right away if:  You feel weak.  You have trouble walking or moving.  You have problems speaking, understanding, or seeing.  You feel confused.  You cannot control when you pee (urinate) or poop (bowel movement).  You lose feeling (numbness) after an injury.  You pass out (faint). This information is not intended to replace advice given to you by your health care provider. Make sure you discuss any questions you have with your health care provider. Document Released: 09/27/2008 Document Revised: 03/22/2016 Document Reviewed: 10/11/2014 Elsevier Interactive Patient Education  [employer].

## 2017-12-04 ENCOUNTER — Other Ambulatory Visit: Payer: Self-pay | Admitting: Family Medicine

## 2017-12-04 DIAGNOSIS — Z131 Encounter for screening for diabetes mellitus: Secondary | ICD-10-CM

## 2017-12-04 DIAGNOSIS — Z0000 Encounter for general adult medical examination without abnormal findings: Secondary | ICD-10-CM

## 2017-12-04 LAB — PSA: Prostate Specific Ag, Serum: 0.7 ng/mL (ref 0.0–4.0)

## 2017-12-04 LAB — LIPID PANEL
Chol/HDL Ratio: 2.2 ratio (ref 0.0–5.0)
Cholesterol, Total: 180 mg/dL (ref 100–199)
HDL: 81 mg/dL (ref >39)
LDL CALC: 71 mg/dL (ref 0–99)
Triglycerides: 139 mg/dL (ref 0–149)
VLDL CHOLESTEROL CAL: 28 mg/dL (ref 5–40)

## 2017-12-04 LAB — CMP AND LIVER
ALT: 25 IU/L (ref 0–44)
AST: 25 IU/L (ref 0–40)
Albumin: 4.8 g/dL (ref 3.5–5.5)
Alkaline Phosphatase: 55 IU/L (ref 39–117)
BILIRUBIN, DIRECT: 0.21 mg/dL (ref 0.00–0.40)
BUN: 14 mg/dL (ref 6–24)
Bilirubin Total: 0.8 mg/dL (ref 0.0–1.2)
CALCIUM: 9.4 mg/dL (ref 8.7–10.2)
CO2: 19 mmol/L — AB (ref 20–29)
CREATININE: 1.09 mg/dL (ref 0.76–1.27)
Chloride: 109 mmol/L — ABNORMAL HIGH (ref 96–106)
GFR calc non Af Amer: 78 mL/min/{1.73_m2} (ref >59)
GFR, EST AFRICAN AMERICAN: 90 mL/min/{1.73_m2} (ref >59)
Glucose: 82 mg/dL (ref 65–99)
Potassium: 4.1 mmol/L (ref 3.5–5.2)
SODIUM: 143 mmol/L (ref 134–144)
TOTAL PROTEIN: 7.7 g/dL (ref 6.0–8.5)

## 2017-12-04 LAB — TSH: TSH: 1.11 u[IU]/mL (ref 0.450–4.500)

## 2017-12-04 LAB — VITAMIN D 25 HYDROXY (VIT D DEFICIENCY, FRACTURES): VIT D 25 HYDROXY: 22.1 ng/mL — AB (ref 30.0–100.0)

## 2017-12-05 NOTE — Addendum Note (Signed)
Addended by: Octaviano Glow on: 12/05/2017 08:45 AM   Modules accepted: Orders

## 2017-12-09 ENCOUNTER — Telehealth: Payer: Self-pay | Admitting: *Deleted

## 2017-12-09 ENCOUNTER — Other Ambulatory Visit: Payer: Self-pay | Admitting: Family Medicine

## 2017-12-09 DIAGNOSIS — E782 Mixed hyperlipidemia: Secondary | ICD-10-CM

## 2017-12-09 DIAGNOSIS — E559 Vitamin D deficiency, unspecified: Secondary | ICD-10-CM

## 2017-12-09 MED ORDER — ROSUVASTATIN CALCIUM 20 MG PO TABS
20.0000 mg | ORAL_TABLET | Freq: Every day | ORAL | 5 refills | Status: DC
Start: 2017-12-09 — End: 2018-03-03

## 2017-12-09 MED ORDER — VITAMIN D (ERGOCALCIFEROL) 1.25 MG (50000 UNIT) PO CAPS: ORAL_CAPSULE | ORAL | 0 refills | Status: DC

## 2017-12-09 NOTE — Telephone Encounter (Signed)
LVM to return call    Unable to communicate with pt for the following information. Need dm screening and complete blood count needs to be obtained. He can schedule walk in lab only visit for these  Kidney function normal  Liver function normal  Cholesterol levels normal.  Vitamin D level was low. Vitamin D helps to keep bones strong. You were prescribed ergocalciferol (capsules) to increase your vitamin-d level. Once finished start taking OTC vitamin d supplement with 400 international units (IU) of vitamin-d per day.  Thyroid function normal  PSA is normal. PSA is screens for prostate problems

## 2018-01-06 MED FILL — LISINOPRIL 40 MG TAB: 40 | 30 days supply | Qty: 30 | Fill #0

## 2018-01-06 MED FILL — ?AMLODIPINE BESYLATE 5 MG T: 5 MG | 30 days supply | Qty: 30 | Fill #0

## 2018-01-06 MED FILL — NAPROXEN 500 MG TABLET: 500 | 20 days supply | Qty: 40 | Fill #0

## 2018-01-06 MED FILL — ROSUVASTATIN CALCIUM 20 MG: 20 | 30 days supply | Qty: 30 | Fill #0

## 2018-01-06 MED FILL — VIT D2 1.25 MG (50,000 UNIT: 1.25 MG | 28 days supply | Qty: 4 | Fill #0

## 2018-02-03 ENCOUNTER — Encounter: Payer: Self-pay | Admitting: Family Medicine

## 2018-02-20 MED FILL — ?AMLODIPINE BESYLATE 5 MG T: 5 MG | 30 days supply | Qty: 30 | Fill #1

## 2018-02-20 MED FILL — VIT D2 1.25 MG (50,000 UNIT: 1.25 MG | 28 days supply | Qty: 4 | Fill #1

## 2018-02-20 MED FILL — LISINOPRIL 40 MG TABLET: 40 | 30 days supply | Qty: 30 | Fill #1

## 2018-02-20 MED FILL — ROSUVASTATIN CALCIUM 20 MG: 20 | 30 days supply | Qty: 30 | Fill #1

## 2018-03-03 ENCOUNTER — Encounter: Payer: Self-pay | Admitting: Family Medicine

## 2018-03-03 ENCOUNTER — Ambulatory Visit: Payer: Self-pay | Attending: Family Medicine | Admitting: Family Medicine

## 2018-03-03 VITALS — BP 146/77 | HR 80 | Temp 97.7°F | Wt 193.8 lb

## 2018-03-03 DIAGNOSIS — Z7982 Long term (current) use of aspirin: Secondary | ICD-10-CM | POA: Insufficient documentation

## 2018-03-03 DIAGNOSIS — F17210 Nicotine dependence, cigarettes, uncomplicated: Secondary | ICD-10-CM | POA: Insufficient documentation

## 2018-03-03 DIAGNOSIS — I10 Essential (primary) hypertension: Secondary | ICD-10-CM | POA: Insufficient documentation

## 2018-03-03 DIAGNOSIS — Z79899 Other long term (current) drug therapy: Secondary | ICD-10-CM | POA: Insufficient documentation

## 2018-03-03 DIAGNOSIS — Z720 Tobacco use: Secondary | ICD-10-CM | POA: Insufficient documentation

## 2018-03-03 DIAGNOSIS — E782 Mixed hyperlipidemia: Secondary | ICD-10-CM | POA: Insufficient documentation

## 2018-03-03 DIAGNOSIS — Z1211 Encounter for screening for malignant neoplasm of colon: Secondary | ICD-10-CM

## 2018-03-03 MED ORDER — LISINOPRIL 40 MG PO TABS: 40.0000 mg | ORAL_TABLET | Freq: Every day | ORAL | 5 refills | Status: DC

## 2018-03-03 MED ORDER — AMLODIPINE BESYLATE 5 MG PO TABS: 5.0000 mg | ORAL_TABLET | Freq: Every day | ORAL | 5 refills | Status: DC

## 2018-03-03 MED ORDER — ROSUVASTATIN CALCIUM 20 MG PO TABS: 20.0000 mg | ORAL_TABLET | Freq: Every day | ORAL | 5 refills | Status: DC

## 2018-03-03 NOTE — Progress Notes (Signed)
Subjective:  Patient ID: Jason Pacheco, male    DOB: August 06, 1966  Age: 52 y.o. MRN: [id number]  CC: Hypertension   HPI Jason Pacheco is a 52 year old male with a history of hypertension, hyperlipidemia, tobacco abuse who presents today to establish care with me. He smokes anywhere from 4 to 15 cigarettes/day and has tried nicotine patches in the past; informs me he is working on quitting. With regards to his hypertension he has been compliant with his antihypertensive and denies adverse effects; he also tolerates his statin with no complaints of myalgias. He was previously referred for colonoscopy however attempt of [facility] to get in touch with him failed.  His previous PCP had also referred him to a hand specialist due to paresthesias of his left hand however he is yet to obtain an appointment and notes in the chart indicates this was due to his not having the orange-colored which he informs me he does have.  Past Medical History:  Diagnosis Date  . Hypertension     History reviewed. No pertinent surgical history.  Outpatient Medications Prior to Visit  Medication Sig Dispense Refill  . aspirin EC 81 MG tablet Take 1 tablet (81 mg total) by mouth daily. (Patient not taking: Reported on 12/03/2017) 30 tablet 2  . ibuprofen (ADVIL,MOTRIN) 600 MG tablet Take 1 tablet (600 mg total) by mouth every 8 (eight) hours as needed for moderate pain. (Patient not taking: Reported on 12/03/2017) 30 tablet 0  . naproxen (NAPROSYN) 500 MG tablet Take 1 tablet (500 mg total) by mouth every 12 (twelve) hours as needed for mild pain or moderate pain. Take with food. 40 tablet 0  . Vitamin D, Ergocalciferol, (DRISDOL) 50000 units CAPS capsule TAKE ONE TABLE BY MOUTH ONCE A WEEK. FOR 12 WEEKS. 12 capsule 0  . amLODipine (NORVASC) 5 MG tablet Take 1 tablet (5 mg total) by mouth daily. 30 tablet 5  . lisinopril (PRINIVIL,ZESTRIL) 40 MG tablet Take 1 tablet (40 mg total) by mouth daily. 30 tablet 5  .  rosuvastatin (CRESTOR) 20 MG tablet Take 1 tablet (20 mg total) by mouth daily. 30 tablet 5   No facility-administered medications prior to visit.     ROS Review of Systems  Constitutional: Negative for activity change and appetite change.  HENT: Negative for sinus pressure and sore throat.   Eyes: Negative for visual disturbance.  Respiratory: Negative for cough, chest tightness and shortness of breath.   Cardiovascular: Negative for chest pain and leg swelling.  Gastrointestinal: Negative for abdominal distention, abdominal pain, constipation and diarrhea.  Endocrine: Negative.   Genitourinary: Negative for dysuria.  Musculoskeletal: Negative for joint swelling and myalgias.  Skin: Negative for rash.  Allergic/Immunologic: Negative.   Neurological: Negative for weakness, light-headedness and numbness.  Psychiatric/Behavioral: Negative for dysphoric mood and suicidal ideas.    Objective:  BP (!) 146/77   Pulse 80   Temp 97.7 F (36.5 C) (Oral)   Wt 193 lb 12.8 oz (87.9 kg)   SpO2 98%   BMI 28.62 kg/m   BP/Weight 03/03/2018 12/03/2017 0/27/7412  Systolic BP 878 676 720  Diastolic BP 77 78 68  Wt. (Lbs) 193.8 190 184  BMI 28.62 28.06 27.17      Physical Exam  Constitutional: He is oriented to person, place, and time. He appears well-developed and well-nourished.  Cardiovascular: Normal rate, normal heart sounds and intact distal pulses.  No murmur heard. Pulmonary/Chest: Effort normal and breath sounds normal. He has no wheezes. He  has no rales. He exhibits no tenderness.  Abdominal: Soft. Bowel sounds are normal. He exhibits no distension and no mass. There is no tenderness.  Musculoskeletal: Normal range of motion.  Neurological: He is alert and oriented to person, place, and time.  Skin: Skin is warm and dry.  Psychiatric: He has a normal mood and affect.     CMP Latest Ref Rng & Units 12/03/2017 01/23/2017 01/09/2017  Glucose 65 - 99 mg/dL 82 76 91  BUN 6 - 24 mg/dL  14 20 19   Creatinine 0.76 - 1.27 mg/dL 1.09 1.02 1.12  Sodium 134 - 144 mmol/L 143 137 136  Potassium 3.5 - 5.2 mmol/L 4.1 4.4 3.9  Chloride 96 - 106 mmol/L 109(H) 100 101  CO2 20 - 29 mmol/L 19(L) 19 26  Calcium 8.7 - 10.2 mg/dL 9.4 8.7 9.6  Total Protein 6.0 - 8.5 g/dL 7.7 7.3 -  Total Bilirubin 0.0 - 1.2 mg/dL 0.8 0.6 -  Alkaline Phos 39 - 117 IU/L 55 61 -  AST 0 - 40 IU/L 25 27 -  ALT 0 - 44 IU/L 25 29 -    Lipid Panel     Component Value Date/Time   CHOL 180 12/03/2017 1653   TRIG 139 12/03/2017 1653   HDL 81 12/03/2017 1653   CHOLHDL 2.2 12/03/2017 1653   CHOLHDL 3.2 01/09/2017 1707   VLDL 19 01/09/2017 1707   LDLCALC 71 12/03/2017 1653    Assessment & Plan:   1. Essential hypertension Likely elevated, no regimen change today Counseled on blood pressure goal of less than 130/80, low-sodium, DASH diet, medication compliance, 150 minutes of moderate intensity exercise per week. Discussed medication compliance, adverse effects. - amLODipine (NORVASC) 5 MG tablet; Take 1 tablet (5 mg total) by mouth daily.  Dispense: 30 tablet; Refill: 5 - lisinopril (PRINIVIL,ZESTRIL) 40 MG tablet; Take 1 tablet (40 mg total) by mouth daily.  Dispense: 30 tablet; Refill: 5  2. Mixed hyperlipidemia Controlled Low-cholesterol diet - rosuvastatin (CRESTOR) 20 MG tablet; Take 1 tablet (20 mg total) by mouth daily.  Dispense: 30 tablet; Refill: 5  3. Screening for colon cancer - Ambulatory referral to Gastroenterology  4. Tobacco abuse Spent 3 minutes counseling on hazardous effects of smoking and he is working on quitting   Meds ordered this encounter  Medications  . amLODipine (NORVASC) 5 MG tablet    Sig: Take 1 tablet (5 mg total) by mouth daily.    Dispense:  30 tablet    Refill:  5  . lisinopril (PRINIVIL,ZESTRIL) 40 MG tablet    Sig: Take 1 tablet (40 mg total) by mouth daily.    Dispense:  30 tablet    Refill:  5  . rosuvastatin (CRESTOR) 20 MG tablet    Sig: Take 1  tablet (20 mg total) by mouth daily.    Dispense:  30 tablet    Refill:  5    Follow-up: Return in about 3 months (around 06/03/2018) for follow up of chronic medical conditions.   Charlott Rakes MD

## 2018-03-03 NOTE — Patient Instructions (Signed)

## 2018-03-31 MED FILL — LISINOPRIL 40 MG TABLET: 40 | 30 days supply | Qty: 30 | Fill #2

## 2018-04-01 MED FILL — VIT D2 1.25 MG (50,000 UNIT: 1.25 MG | 28 days supply | Qty: 4 | Fill #2

## 2018-04-01 MED FILL — ROSUVASTATIN CALCIUM 20 MG: 20 | 30 days supply | Qty: 30 | Fill #2

## 2018-04-01 MED FILL — ?AMLODIPINE BESYLATE 5 MG T: 5 MG | 30 days supply | Qty: 30 | Fill #2

## 2018-05-13 MED FILL — AMLODIPINE BESYLATE 5 MG TA: 5 | 30 days supply | Qty: 30 | Fill #3

## 2018-05-13 MED FILL — LISINOPRIL 40 MG TABLET: 40 | 30 days supply | Qty: 30 | Fill #3

## 2018-05-13 MED FILL — ROSUVASTATIN CALCIUM 20 MG: 20 | 30 days supply | Qty: 30 | Fill #3

## 2018-06-03 ENCOUNTER — Ambulatory Visit: Payer: Self-pay | Admitting: Family Medicine

## 2018-07-17 MED FILL — LISINOPRIL 40 MG TABLET: 40 | 30 days supply | Qty: 30 | Fill #4

## 2018-09-09 MED FILL — LISINOPRIL 40 MG TABLET: 40 | 30 days supply | Qty: 30 | Fill #5

## 2018-12-30 ENCOUNTER — Encounter: Payer: Self-pay | Admitting: Family Medicine

## 2018-12-30 ENCOUNTER — Ambulatory Visit: Payer: Self-pay | Attending: Family Medicine | Admitting: Family Medicine

## 2018-12-30 VITALS — BP 149/99 | HR 84 | Temp 97.7°F | Ht 69.0 in | Wt 200.2 lb

## 2018-12-30 DIAGNOSIS — E782 Mixed hyperlipidemia: Secondary | ICD-10-CM

## 2018-12-30 DIAGNOSIS — I10 Essential (primary) hypertension: Secondary | ICD-10-CM

## 2018-12-30 DIAGNOSIS — Z720 Tobacco use: Secondary | ICD-10-CM

## 2018-12-30 DIAGNOSIS — F1010 Alcohol abuse, uncomplicated: Secondary | ICD-10-CM

## 2018-12-30 MED ORDER — LISINOPRIL 40 MG PO TABS: 40.0000 mg | ORAL_TABLET | Freq: Every day | ORAL | 5 refills | Status: DC

## 2018-12-30 MED ORDER — AMLODIPINE BESYLATE 5 MG PO TABS: 5.0000 mg | ORAL_TABLET | Freq: Every day | ORAL | 5 refills | Status: DC

## 2018-12-30 MED ORDER — ROSUVASTATIN CALCIUM 20 MG PO TABS: 20.0000 mg | ORAL_TABLET | Freq: Every day | ORAL | 5 refills | Status: DC

## 2018-12-30 MED FILL — LISINOPRIL 40 MG TABLET: 40 | 30 days supply | Qty: 30 | Fill #0

## 2018-12-30 MED FILL — ROSUVASTATIN CALCIUM 20 MG: 20 | 30 days supply | Qty: 30 | Fill #0

## 2018-12-30 MED FILL — AMLODIPINE BESYLATE 5 MG TA: 5 | 30 days supply | Qty: 30 | Fill #0

## 2018-12-30 NOTE — Patient Instructions (Signed)
What You Need To Know About Alcohol Abuse and Dependence, Adult Alcohol is a widely available drug. People who use alcohol will consume it in varying amounts. People who drink alcohol in excess, and have behavior problems during and after drinking alcohol, may have what is called an alcohol use disorder. Alcohol abuse and alcohol dependence are the two main types of alcohol use disorders:  Alcohol abuse is when you use alcohol too much or too often. You may use alcohol to make yourself feel happy or to reduce stress, but you may have a hard time setting a limit on the amount you drink.  Alcohol dependence is when you use alcohol excessively for a period of time, and your body and brain chemistry changes as a result. This can make it hard to stop drinking because you may start to feel sick or feel different when you do not use alcohol. How can alcohol abuse and dependence affect me? Alcohol abuse and dependence can have a negative effect on your life. Excessive use of alcohol may lead to an addiction. You may feel like you need alcohol to function normally. You may drink alcohol before work in the morning, during the day, or as soon as you get home from work in the evening. These actions can result in:  Poor performance at work.  Losing your job.  Financial problems.  Car crashes or criminal charges from driving after drinking alcohol.  Problems in your relationships with friends and family.  Losing the trust and respect of co-workers, friends, and family. Drinking heavily over a long period of time can permanently damage your body and brain, and can cause lifelong health issues, such as:  Liver disease.  Heart problems, high blood pressure, or stroke.  Damage to your pancreas.  Certain cancers.  Decreased ability to fight infections.  Numbness or tingling in hands or feet (neuropathy).  Brain damage.  Depression.  Early (premature) death. When your body craves alcohol, it is  easy to drink more than your body can handle. As a result, you may overdose. Alcohol overdose is a serious situation that requires hospitalization. It may lead to permanent injuries or death. What are the benefits of avoiding alcohol use? Limiting or avoiding alcohol can help you:  Avoid risks to your body, brain, and relationships.  Avoid the risk of abusing or becoming dependent on alcohol.  Keep your mind and body healthy. As a result, you may be more likely to accomplish your life goals.  Avoid permanent injury, organ damage, or death due to alcohol use. What steps can I take to stop drinking?   The best way to avoid alcohol abuse, dependence, and addiction is not to drink at all, or to drink measured amounts. Measured drinking means no more than 1 drink a day for nonpregnant women and 2 drinks a day for men. One drink equals 12 oz of beer, 5 oz of wine, or 1 oz of hard liquor.  Stop drinking if you have been drinking too much. This can be very hard to do if you are used to abusing alcohol. If you find it hard to stop drinking, talk about your experience with someone you trust. This person may be able to help you change your drinking behavior.  Instead of drinking alcohol, do something else, like a hobby or exercise.  Find healthy ways to cope with stress, such as exercise, meditation, or spending time with people you care about.  In social gatherings and places where there may   be alcohol, make intentional choices to drink non-alcohol beverages.  If your family, co-workers, or friends drink, talk to them about supporting you in your efforts to stop drinking. Ask them not to drink around you. Spend more time with people who do not drink alcohol.  If you think that you have an alcohol dependency problem: ? Tell friends or family about your concerns. ? Talk with your health care provider or another health professional about where to get help. ? Work with a therapist and a chemical  dependency counselor. ? Consider joining a support group for people who struggle with alcohol abuse, dependence, and addiction. Where to find support You can get support for preventing alcohol abuse, dependence, and addiction from:  Your health care provider.  Alcoholics Anonymous (AA): www.aa.org  SMART Recovery: www.smartrecovery.org  Local treatment centers or chemical dependency counselors. Where to find more information Learn more about alcohol abuse and dependence from:  Centers for Disease Control and Prevention: www.cdc.gov/alcohol/fact-sheets/alcohol-use.htm  National Institute on Alcohol Abuse and Alcoholism: www.niaaa.nih.gov/alcohol-health/overview-alcohol-consumption  Local AA groups in your community. Contact a health care provider if:  You drink more or for longer than you intended, on more than one occasion.  You tried to stop drinking or to cut back on how much you drink, but you were not able to.  You often drink to the point of vomiting or passing out.  You want to drink so badly that you cannot think about anything else.  Drinking has created problems in your life, but you continue to drink.  You keep drinking even though you feel anxious, depressed, or have experienced memory loss.  You have stopped doing the things you used to enjoy in order to drink.  You have to drink more than you used to in order to get the effect you want.  You experience anxiety, sweating, nausea, shakiness, and trouble sleeping when you try to stop drinking.  You have thoughts about hurting yourself or others. If you ever feel like you may hurt yourself or others, or have thoughts about taking your own life, get help right away. You can go to your nearest emergency department or call:  Your local emergency services (911 in the [place]  A suicide crisis helpline, such as the National Suicide Prevention Lifeline at 1-800-273-8255. This is open 24 hours a day. Summary  Alcohol  is a widely available drug. Misusing, abusing, and becoming dependent on alcohol can cause many problems.  It is important to measure and limit the amount of alcohol you consume. It is recommended to limit alcohol use to 1 drink a day for nonpregnant women and 2 drinks a day for men.  The risks associated with drinking too much will have a direct negative impact on your work, relationships, and health.  If you realize that you are having some challenges keeping your drinking under control, find some ways to change your behavior. Hobbies, self calming activities, exercise, or support groups can help.  If you feel you need help with changing your drinking habits, talk with your health care provider, a good friend, or a therapist, or go to an AA group. This information is not intended to replace advice given to you by your health care provider. Make sure you discuss any questions you have with your health care provider. Document Released: 10/09/2016 Document Revised: 10/09/2016 Document Reviewed: 10/09/2016 Elsevier Interactive Patient Education  2019 [employer]  

## 2018-12-30 NOTE — Progress Notes (Signed)
Patient has not had medication in over a month.

## 2018-12-30 NOTE — Progress Notes (Signed)
Subjective:  Patient ID: Jason Pacheco, male    DOB: July 28, 1966  Age: 53 y.o. MRN: [id number]  CC: Hypertension   HPI Jason Pacheco  is a 53 year old male with a history of hypertension, hyperlipidemia, tobacco abuse who presents today for a follow-up visit. He has been out of his antihypertensive hence his elevated blood pressure.  He has also been out of his statin and would like a refill. He continues to smoke about 5 cigarettes/day and drinks alcohol about 40 ounces of beer and 2 shots of liquor every other day.  He states he drinks to calm himself down as he is usually hyperactive when he gets home from work. He has no additional concerns today.  Past Medical History:  Diagnosis Date  . Hypertension     History reviewed. No pertinent surgical history.  Family History  Problem Relation Age of Onset  . Hypertension Mother   . Heart disease Mother     No Known Allergies  Outpatient Medications Prior to Visit  Medication Sig Dispense Refill  . aspirin EC 81 MG tablet Take 1 tablet (81 mg total) by mouth daily. (Patient not taking: Reported on 12/03/2017) 30 tablet 2  . ibuprofen (ADVIL,MOTRIN) 600 MG tablet Take 1 tablet (600 mg total) by mouth every 8 (eight) hours as needed for moderate pain. (Patient not taking: Reported on 12/03/2017) 30 tablet 0  . naproxen (NAPROSYN) 500 MG tablet Take 1 tablet (500 mg total) by mouth every 12 (twelve) hours as needed for mild pain or moderate pain. Take with food. (Patient not taking: Reported on 12/30/2018) 40 tablet 0  . Vitamin D, Ergocalciferol, (DRISDOL) 50000 units CAPS capsule TAKE ONE TABLE BY MOUTH ONCE A WEEK. FOR 12 WEEKS. (Patient not taking: Reported on 12/30/2018) 12 capsule 0  . amLODipine (NORVASC) 5 MG tablet Take 1 tablet (5 mg total) by mouth daily. (Patient not taking: Reported on 12/30/2018) 30 tablet 5  . lisinopril (PRINIVIL,ZESTRIL) 40 MG tablet Take 1 tablet (40 mg total) by mouth daily. (Patient not taking: Reported on  12/30/2018) 30 tablet 5  . rosuvastatin (CRESTOR) 20 MG tablet Take 1 tablet (20 mg total) by mouth daily. (Patient not taking: Reported on 12/30/2018) 30 tablet 5   No facility-administered medications prior to visit.      ROS Review of Systems General: negative for fever, weight loss, appetite change Eyes: no visual symptoms. ENT: no ear symptoms, no sinus tenderness, no nasal congestion or sore throat. Neck: no pain  Respiratory: no wheezing, shortness of breath, cough Cardiovascular: no chest pain, no dyspnea on exertion, no pedal edema, no orthopnea. Gastrointestinal: no abdominal pain, no diarrhea, no constipation Genito-Urinary: no urinary frequency, no dysuria, no polyuria. Hematologic: no bruising Endocrine: no cold or heat intolerance Neurological: no headaches, no seizures, no tremors Musculoskeletal: no joint pains, no joint swelling Skin: no pruritus, no rash. Psychological: no depression, no anxiety,    Objective:  BP (!) 149/99   Pulse 84   Temp 97.7 F (36.5 C) (Oral)   Ht 5\' 9"  (1.753 m)   Wt 200 lb 3.2 oz (90.8 kg)   SpO2 98%   BMI 29.56 kg/m   BP/Weight 12/30/2018 12/04/6387 01/03/3427  Systolic BP 768 115 726  Diastolic BP 99 77 78  Wt. (Lbs) 200.2 193.8 190  BMI 29.56 28.62 28.06      Physical Exam Constitutional: normal appearing,  Eyes: PERRLA HEENT: Head is atraumatic, normal sinuses, normal oropharynx, normal appearing tonsils and palate, tympanic membrane is  normal bilaterally. Neck: normal range of motion, no thyromegaly, no JVD Cardiovascular: normal rate and rhythm, normal heart sounds, no murmurs, rub or gallop, no pedal edema Respiratory: Normal breath sounds, clear to auscultation bilaterally, no wheezes, no rales, no rhonchi Abdomen: soft, not tender to palpation, normal bowel sounds, no enlarged organs Musculoskeletal: Full ROM, no tenderness in joints Skin: warm and dry, no lesions. Neurological: alert, oriented x3, cranial nerves I-XII  grossly intact , normal motor strength, normal sensation. Psychological: normal mood.   CMP Latest Ref Rng & Units 12/03/2017 01/23/2017 01/09/2017  Glucose 65 - 99 mg/dL 82 76 91  BUN 6 - 24 mg/dL 14 20 19   Creatinine 0.76 - 1.27 mg/dL 1.09 1.02 1.12  Sodium 134 - 144 mmol/L 143 137 136  Potassium 3.5 - 5.2 mmol/L 4.1 4.4 3.9  Chloride 96 - 106 mmol/L 109(H) 100 101  CO2 20 - 29 mmol/L 19(L) 19 26  Calcium 8.7 - 10.2 mg/dL 9.4 8.7 9.6  Total Protein 6.0 - 8.5 g/dL 7.7 7.3 -  Total Bilirubin 0.0 - 1.2 mg/dL 0.8 0.6 -  Alkaline Phos 39 - 117 IU/L 55 61 -  AST 0 - 40 IU/L 25 27 -  ALT 0 - 44 IU/L 25 29 -    Lipid Panel     Component Value Date/Time   CHOL 180 12/03/2017 1653   TRIG 139 12/03/2017 1653   HDL 81 12/03/2017 1653   CHOLHDL 2.2 12/03/2017 1653   CHOLHDL 3.2 01/09/2017 1707   VLDL 19 01/09/2017 1707   LDLCALC 71 12/03/2017 1653    CBC    Component Value Date/Time   WBC 5.3 01/23/2017 1632   WBC 5.5 12/18/2015 0308   RBC 5.11 01/23/2017 1632   RBC 4.70 12/18/2015 0308   HGB 14.1 01/23/2017 1632   HCT 43.2 01/23/2017 1632   PLT 236 01/23/2017 1632   MCV 85 01/23/2017 1632   MCH 27.6 01/23/2017 1632   MCH 28.7 12/18/2015 0308   MCHC 32.6 01/23/2017 1632   MCHC 32.8 12/18/2015 0308   RDW 13.6 01/23/2017 1632   LYMPHSABS 2.1 01/23/2017 1632   MONOABS 0.5 12/17/2015 1556   EOSABS 0.2 01/23/2017 1632   BASOSABS 0.0 01/23/2017 1632    No results found for: HGBA1C  Assessment & Plan:   1. Essential hypertension Slightly elevated Secondary to being out of medications which I have refilled Counseled on blood pressure goal of less than 130/80, low-sodium, DASH diet, medication compliance, 150 minutes of moderate intensity exercise per week. Discussed medication compliance, adverse effects. - amLODipine (NORVASC) 5 MG tablet; Take 1 tablet (5 mg total) by mouth daily.  Dispense: 30 tablet; Refill: 5 - lisinopril (PRINIVIL,ZESTRIL) 40 MG tablet; Take 1 tablet  (40 mg total) by mouth daily.  Dispense: 30 tablet; Refill: 5  2. Mixed hyperlipidemia Previously controlled We will hold off till next visit to what her lipid panel as levels are likely to be elevated given he has been out of his statin - rosuvastatin (CRESTOR) 20 MG tablet; Take 1 tablet (20 mg total) by mouth daily.  Dispense: 30 tablet; Refill: 5  3. Tobacco abuse Spent 3 minutes counseling on hazardous effect of smoking and smoking cessation recommended and he is willing to work on quitting  4. Alcohol abuse We have discussed complications of alcohol abuse and he is not interested in alcohol Anonymous at this time but promises to work on quitting   Meds ordered this encounter  Medications  . amLODipine (NORVASC)  5 MG tablet    Sig: Take 1 tablet (5 mg total) by mouth daily.    Dispense:  30 tablet    Refill:  5  . lisinopril (PRINIVIL,ZESTRIL) 40 MG tablet    Sig: Take 1 tablet (40 mg total) by mouth daily.    Dispense:  30 tablet    Refill:  5  . rosuvastatin (CRESTOR) 20 MG tablet    Sig: Take 1 tablet (20 mg total) by mouth daily.    Dispense:  30 tablet    Refill:  5    Follow-up: Return in about 3 months (around 04/01/2019) for Follow-up on chronic medical conditions.       Charlott Rakes, MD, FAAFP. [facility] and [place], [place]   12/30/2018, 4:07 PM

## 2019-02-02 MED FILL — AMLODIPINE BESYLATE 5 MG TA: 5 | 30 days supply | Qty: 30 | Fill #1

## 2019-02-02 MED FILL — LISINOPRIL 40 MG TABLET: 40 | 30 days supply | Qty: 30 | Fill #1

## 2019-02-02 MED FILL — ROSUVASTATIN CALCIUM 20 MG: 20 | 30 days supply | Qty: 30 | Fill #1

## 2019-03-24 MED FILL — LISINOPRIL 40 MG TABLET: 40 | 30 days supply | Qty: 30 | Fill #2

## 2019-03-24 MED FILL — ROSUVASTATIN CALCIUM 20 MG: 20 | 30 days supply | Qty: 30 | Fill #2

## 2019-03-24 MED FILL — AMLODIPINE BESYLATE 5 MG TA: 5 | 30 days supply | Qty: 30 | Fill #2

## 2019-04-07 ENCOUNTER — Encounter: Payer: Self-pay | Admitting: Family Medicine

## 2019-04-07 ENCOUNTER — Other Ambulatory Visit: Payer: Self-pay

## 2019-04-07 ENCOUNTER — Ambulatory Visit: Payer: Self-pay | Attending: Family Medicine | Admitting: Family Medicine

## 2019-04-07 VITALS — BP 144/73 | HR 86 | Temp 98.3°F | Ht 69.0 in | Wt 194.0 lb

## 2019-04-07 DIAGNOSIS — E782 Mixed hyperlipidemia: Secondary | ICD-10-CM

## 2019-04-07 DIAGNOSIS — I10 Essential (primary) hypertension: Secondary | ICD-10-CM

## 2019-04-07 DIAGNOSIS — Z1211 Encounter for screening for malignant neoplasm of colon: Secondary | ICD-10-CM

## 2019-04-07 DIAGNOSIS — M65332 Trigger finger, left middle finger: Secondary | ICD-10-CM

## 2019-04-07 MED ORDER — AMLODIPINE BESYLATE 5 MG PO TABS: 5.0000 mg | ORAL_TABLET | Freq: Every day | ORAL | 5 refills | Status: DC

## 2019-04-07 MED ORDER — PREDNISONE 20 MG PO TABS: 20.0000 mg | ORAL_TABLET | Freq: Two times a day (BID) | ORAL | 0 refills | Status: DC

## 2019-04-07 MED ORDER — ROSUVASTATIN CALCIUM 20 MG PO TABS: 20.0000 mg | ORAL_TABLET | Freq: Every day | ORAL | 5 refills | Status: DC

## 2019-04-07 MED ORDER — LISINOPRIL 20 MG PO TABS: 20.0000 mg | ORAL_TABLET | Freq: Every day | ORAL | 6 refills | Status: DC

## 2019-04-07 MED ORDER — LISINOPRIL 40 MG PO TABS: 40.0000 mg | ORAL_TABLET | Freq: Every day | ORAL | 5 refills | Status: DC

## 2019-04-07 MED FILL — predniSONE 20 MG TABS: 20 | 5 days supply | Qty: 10 | Fill #0

## 2019-04-07 MED FILL — LISINOPRIL 20 MG TABLET: 20 | 30 days supply | Qty: 30 | Fill #0

## 2019-04-07 NOTE — Addendum Note (Signed)
Addended by: Charlott Rakes on: 04/07/2019 04:57 PM   Modules accepted: Orders

## 2019-04-07 NOTE — Patient Instructions (Signed)
Trigger Finger    Trigger finger (stenosing tenosynovitis) is a condition that causes a finger to get stuck in a bent position. Each finger has a tough, cord-like tissue that connects muscle to bone (tendon), and each tendon is surrounded by a tunnel of tissue (tendon sheath). To move your finger, your tendon needs to slide freely through the sheath. Trigger finger happens when the tendon or the sheath thickens, making it difficult to move your finger.  Trigger finger can affect any finger or a thumb. It may affect more than one finger. Mild cases may clear up with rest and medicine. Severe cases require more treatment.  What are the causes?  Trigger finger is caused by a thickened finger tendon or tendon sheath. The cause of this thickening is not known.  What increases the risk?  The following factors may make you more likely to develop this condition:   Doing activities that require a strong grip.   Having rheumatoid arthritis, gout, or diabetes.   Being 40-60 years old.   Being a woman.  What are the signs or symptoms?  Symptoms of this condition include:   Pain when bending or straightening your finger.   Tenderness or swelling where your finger attaches to the palm of your hand.   A lump in the palm of your hand or on the inside of your finger.   Hearing a popping sound when you try to straighten your finger.   Feeling a popping, catching, or locking sensation when you try to straighten your finger.   Being unable to straighten your finger.  How is this diagnosed?  This condition is diagnosed based on your symptoms and a physical exam.  How is this treated?  This condition may be treated by:   Resting your finger and avoiding activities that make symptoms worse.   Wearing a finger splint to keep your finger in a slightly bent position.   Taking NSAIDs to relieve pain and swelling.   Injecting medicine (steroids) into the tendon sheath to reduce swelling and irritation. Injections may need to be  repeated.   Having surgery to open the tendon sheath. This may be done if other treatments do not work and you cannot straighten your finger. You may need physical therapy after surgery.  Follow these instructions at home:     Use moist heat to help reduce pain and swelling as told by your health care provider.   Rest your finger and avoid activities that make pain worse. Return to normal activities as told by your health care provider.   If you have a splint, wear it as told by your health care provider.   Take over-the-counter and prescription medicines only as told by your health care provider.   Keep all follow-up visits as told by your health care provider. This is important.  Contact a health care provider if:   Your symptoms are not improving with home care.  Summary   Trigger finger (stenosing tenosynovitis) causes your finger to get stuck in a bent position, and it can make it difficult and painful to straighten your finger.   This condition develops when a finger tendon or tendon sheath thickens.   Treatment starts with resting, wearing a splint, and taking NSAIDs.   In severe cases, surgery to open the tendon sheath may be needed.  This information is not intended to replace advice given to you by your health care provider. Make sure you discuss any questions you have with your   health care provider.  Document Released: 08/04/2004 Document Revised: 09/25/2016 Document Reviewed: 09/25/2016  Elsevier Interactive Patient Education  2019 [employer] Inc[employer]

## 2019-04-07 NOTE — Progress Notes (Addendum)
Subjective:  Patient ID: Jason Pacheco, male    DOB: April 02, 1966  Age: 53 y.o. MRN: [id number]  CC: Hypertension   HPI Jason Pacheco is a 53 year old male with a history of hypertension, hyperlipidemia, tobacco abuse who presents today for a follow-up visit. He is yet to take his antihypertensive today as he was eager to see what his blood pressure would look like without medications given he has been exercising and working on weight loss. He has been compliant with his statin and denies adverse effects from his medication.  He complains of pain on the palmar aspect of his left middle MCP joint with associated locking of his left middle finger.  He was diagnosed with trigger finger of the right middle finger and has received cortisone injections in the past.  He is right-handed  Past Medical History:  Diagnosis Date  . Hypertension     History reviewed. No pertinent surgical history.  Family History  Problem Relation Age of Onset  . Hypertension Mother   . Heart disease Mother     No Known Allergies  Outpatient Medications Prior to Visit  Medication Sig Dispense Refill  . amLODipine (NORVASC) 5 MG tablet Take 1 tablet (5 mg total) by mouth daily. 30 tablet 5  . lisinopril (PRINIVIL,ZESTRIL) 40 MG tablet Take 1 tablet (40 mg total) by mouth daily. 30 tablet 5  . rosuvastatin (CRESTOR) 20 MG tablet Take 1 tablet (20 mg total) by mouth daily. 30 tablet 5  . aspirin EC 81 MG tablet Take 1 tablet (81 mg total) by mouth daily. (Patient not taking: Reported on 12/03/2017) 30 tablet 2  . ibuprofen (ADVIL,MOTRIN) 600 MG tablet Take 1 tablet (600 mg total) by mouth every 8 (eight) hours as needed for moderate pain. (Patient not taking: Reported on 12/03/2017) 30 tablet 0  . naproxen (NAPROSYN) 500 MG tablet Take 1 tablet (500 mg total) by mouth every 12 (twelve) hours as needed for mild pain or moderate pain. Take with food. (Patient not taking: Reported on 12/30/2018) 40 tablet 0  . Vitamin D,  Ergocalciferol, (DRISDOL) 50000 units CAPS capsule TAKE ONE TABLE BY MOUTH ONCE A WEEK. FOR 12 WEEKS. (Patient not taking: Reported on 12/30/2018) 12 capsule 0   No facility-administered medications prior to visit.      ROS Review of Systems  Constitutional: Negative for activity change and appetite change.  HENT: Negative for sinus pressure and sore throat.   Eyes: Negative for visual disturbance.  Respiratory: Negative for cough, chest tightness and shortness of breath.   Cardiovascular: Negative for chest pain and leg swelling.  Gastrointestinal: Negative for abdominal distention, abdominal pain, constipation and diarrhea.  Endocrine: Negative.   Genitourinary: Negative for dysuria.  Musculoskeletal: Negative for joint swelling and myalgias.  Skin: Negative for rash.  Allergic/Immunologic: Negative.   Neurological: Negative for weakness, light-headedness and numbness.  Psychiatric/Behavioral: Negative for dysphoric mood and suicidal ideas.    Objective:  BP (!) 144/73   Pulse 86   Temp 98.3 F (36.8 C) (Oral)   Ht 5' 9" (1.753 m)   Wt 194 lb (88 kg)   SpO2 96%   BMI 28.65 kg/m   BP/Weight 04/07/2019 2/0/9470 07/05/2835  Systolic BP 629 476 546  Diastolic BP 73 99 77  Wt. (Lbs) 194 200.2 193.8  BMI 28.65 29.56 28.62      Physical Exam Constitutional:      Appearance: He is well-developed.  Cardiovascular:     Rate and Rhythm: Normal rate.  Heart sounds: Normal heart sounds. No murmur.  Pulmonary:     Effort: Pulmonary effort is normal.     Breath sounds: Normal breath sounds. No wheezing or rales.  Chest:     Chest wall: No tenderness.  Abdominal:     General: Bowel sounds are normal. There is no distension.     Palpations: Abdomen is soft. There is no mass.     Tenderness: There is no abdominal tenderness.  Musculoskeletal:     Comments: Sticking of left middle finger on attempting to extend from a flexed position. Able to make a complete fist bilaterally  No tenderness elicited on palpation of joints of the hand bilaterally  Neurological:     Mental Status: He is alert and oriented to person, place, and time.     CMP Latest Ref Rng & Units 12/03/2017 01/23/2017 01/09/2017  Glucose 65 - 99 mg/dL 82 76 91  BUN 6 - 24 mg/dL _0 Creatinine 0.76 - 1.27 mg/dL 1.09 1.02 1.12  Sodium 134 - 144 mmol/L 143 137 136  Potassium 3.5 - 5.2 mmol/L 4.1 4.4 3.9  Chloride 96 - 106 mmol/L 109(H) 100 101  CO2 20 - 29 mmol/L 19(L) 19 26  Calcium 8.7 - 10.2 mg/dL 9.4 8.7 9.6  Total Protein 6.0 - 8.5 g/dL 7.7 7.3 -  Total Bilirubin 0.0 - 1.2 mg/dL 0.8 0.6 -  Alkaline Phos 39 - 117 IU/L 55 61 -  AST 0 - 40 IU/L 25 27 -  ALT 0 - 44 IU/L 25 29 -    Lipid Panel     Component Value Date/Time   CHOL 180 12/03/2017 1653   TRIG 139 12/03/2017 1653   HDL 81 12/03/2017 1653   CHOLHDL 2.2 12/03/2017 1653   CHOLHDL 3.2 01/09/2017 1707   VLDL 19 01/09/2017 1707   LDLCALC 71 12/03/2017 1653    CBC    Component Value Date/Time   WBC 5.3 01/23/2017 1632   WBC 5.5 12/18/2015 0308   RBC 5.11 01/23/2017 1632   RBC 4.70 12/18/2015 0308   HGB 14.1 01/23/2017 1632   HCT 43.2 01/23/2017 1632   PLT 236 01/23/2017 1632   MCV 85 01/23/2017 1632   MCH 27.6 01/23/2017 1632   MCH 28.7 12/18/2015 0308   MCHC 32.6 01/23/2017 1632   MCHC 32.8 12/18/2015 0308   RDW 13.6 01/23/2017 1632   LYMPHSABS 2.1 01/23/2017 1632   MONOABS 0.5 12/17/2015 1556   EOSABS 0.2 01/23/2017 1632   BASOSABS 0.0 01/23/2017 1632    No results found for: HGBA1C  Assessment & Plan:   1. Mixed hyperlipidemia Controlled Low-cholesterol diet He is due for lipid panel which can be performed at his next visit - rosuvastatin (CRESTOR) 20 MG tablet; Take 1 tablet (20 mg total) by mouth daily.  Dispense: 30 tablet; Refill: 5  2. Essential hypertension Uncontrolled due to not taking antihypertensive which I have refilled today Counseled on blood pressure goal of less than 130/80,  low-sodium, DASH diet, medication compliance, 150 minutes of moderate intensity exercise per week. Discussed medication compliance, adverse effects. Reduce lisinopril dose from 40 mg to 20 mg his blood pressure is slightly elevated. - lisinopril (ZESTRIL) 20 MG tablet; Take 1 tablet (20 mg total) by mouth daily.  Dispense: 30 tablet; Refill: 5 - amLODipine (NORVASC) 5 MG tablet; Take 1 tablet (5 mg total) by mouth daily.  Dispense: 30 tablet; Refill: 5 - CMP14+EGFR  3. Trigger middle finger of left hand He  will need a referral to orthopedics for cortisone injections Advised to apply for the [facility] financial discount to first-aid referral - predniSONE (DELTASONE) 20 MG tablet; Take 1 tablet (20 mg total) by mouth 2 (two) times daily with a meal.  Dispense: 10 tablet; Refill: 0  4. Screening for colon cancer He needs a colonoscopy but will need to apply for the [facility] financial discount first I will send off a stool FIT test in the meantime - Fecal occult blood, imunochemical([facility])   Meds ordered this encounter  Medications  . rosuvastatin (CRESTOR) 20 MG tablet    Sig: Take 1 tablet (20 mg total) by mouth daily.    Dispense:  30 tablet    Refill:  5  . lisinopril (ZESTRIL) 40 MG tablet    Sig: Take 1 tablet (40 mg total) by mouth daily.    Dispense:  30 tablet    Refill:  5  . amLODipine (NORVASC) 5 MG tablet    Sig: Take 1 tablet (5 mg total) by mouth daily.    Dispense:  30 tablet    Refill:  5  . predniSONE (DELTASONE) 20 MG tablet    Sig: Take 1 tablet (20 mg total) by mouth 2 (two) times daily with a meal.    Dispense:  10 tablet    Refill:  0    Follow-up: Return in about 6 months (around 10/07/2019) for Follow-up of chronic medical conditions.       Charlott Rakes, MD, FAAFP. [facility] and [place], [place]   04/07/2019, 4:37 PM

## 2019-04-08 LAB — CMP14+EGFR
ALT: 44 IU/L (ref 0–44)
AST: 35 IU/L (ref 0–40)
Albumin/Globulin Ratio: 1.9 (ref 1.2–2.2)
Albumin: 4.6 g/dL (ref 3.8–4.9)
Alkaline Phosphatase: 59 IU/L (ref 39–117)
BUN/Creatinine Ratio: 11 (ref 9–20)
BUN: 12 mg/dL (ref 6–24)
Bilirubin Total: 0.8 mg/dL (ref 0.0–1.2)
CO2: 20 mmol/L (ref 20–29)
Calcium: 9.3 mg/dL (ref 8.7–10.2)
Chloride: 106 mmol/L (ref 96–106)
Creatinine, Ser: 1.12 mg/dL (ref 0.76–1.27)
GFR calc Af Amer: 86 mL/min/{1.73_m2} (ref >59)
GFR calc non Af Amer: 74 mL/min/{1.73_m2} (ref >59)
Globulin, Total: 2.4 g/dL (ref 1.5–4.5)
Glucose: 92 mg/dL (ref 65–99)
Potassium: 4.4 mmol/L (ref 3.5–5.2)
Sodium: 141 mmol/L (ref 134–144)
Total Protein: 7 g/dL (ref 6.0–8.5)

## 2019-04-17 LAB — FECAL OCCULT BLOOD, IMMUNOCHEMICAL: Fecal Occult Bld: NEGATIVE

## 2019-04-20 ENCOUNTER — Telehealth: Payer: Self-pay

## 2019-04-20 NOTE — Telephone Encounter (Signed)
Patient name and DOB has been verified Patient was informed of lab results. Patient had no questions.  

## 2019-04-20 NOTE — Telephone Encounter (Signed)
-----   Message from Charlott Rakes, MD sent at 04/08/2019  5:39 PM EDT ----- Please inform the patient that labs are normal. Thank you.

## 2019-05-13 MED FILL — ?ROSUVASTATIN CALCIUM 20MG: 20 | 30 days supply | Qty: 30 | Fill #3

## 2019-05-13 MED FILL — ?AMLODIPINE BESYLATE 5MG TA: 5 | 30 days supply | Qty: 30 | Fill #3

## 2019-05-13 MED FILL — LISINOPRIL 20 MG TABLET: 20 | 30 days supply | Qty: 30 | Fill #1

## 2019-07-01 MED FILL — ?AMLODIPINE BESYLATE 5MG TA: 5 | 30 days supply | Qty: 30 | Fill #4

## 2019-07-01 MED FILL — ?ROSUVASTATIN CALCIUM 20MG: 20 | 30 days supply | Qty: 30 | Fill #4

## 2019-07-01 MED FILL — LISINOPRIL 20 MG TABLET: 20 | 30 days supply | Qty: 30 | Fill #2

## 2019-09-09 MED FILL — ?AMLODIPINE BESYLATE 5 MG T: 5 MG | 30 days supply | Qty: 30 | Fill #0

## 2019-09-09 MED FILL — LISINOPRIL 20 MG TABLET: 20 | 30 days supply | Qty: 30 | Fill #4

## 2019-09-09 MED FILL — ?ROSUVASTATIN CALCIUM 20MG: 20 | 30 days supply | Qty: 30 | Fill #0

## 2019-10-16 ENCOUNTER — Other Ambulatory Visit: Payer: Self-pay | Admitting: Cardiology

## 2019-10-16 DIAGNOSIS — Z20822 Contact with and (suspected) exposure to covid-19: Secondary | ICD-10-CM

## 2019-10-17 LAB — NOVEL CORONAVIRUS, NAA: SARS-CoV-2, NAA: DETECTED — AB

## 2019-10-18 ENCOUNTER — Telehealth (HOSPITAL_COMMUNITY): Payer: Self-pay | Admitting: Critical Care Medicine

## 2019-10-18 NOTE — Telephone Encounter (Signed)
-----   Message from Carlisle Beers, RN sent at 10/18/2019  7:00 AM EST -----  ----- Message ----- From: Lavone Neri Lab Results In Sent: 10/17/2019  10:36 PM EST To: Mobile Screening Testing Result Pool

## 2019-10-18 NOTE — Telephone Encounter (Signed)
Despite several phone calls I was not able to reach this patient who is Covid positive.  I have sent a message to the patient's primary care provider regarding this patient's positive Covid

## 2019-10-20 NOTE — Telephone Encounter (Signed)
Pt name and DOB verified. Patient aware of results and result note per Dr. Joya Gaskins.   Mr. Jason Pacheco states he was tested in November and was positive.The date of the most recent  COVID test in our system was shared with patient for he felt that it was the results of his previous test in November. Mr. Jason Pacheco was at work. He states in either case he has not been symptomatic.   Please advise-

## 2019-10-21 MED FILL — LISINOPRIL 20 MG TABLET: 20 | 30 days supply | Qty: 30 | Fill #5

## 2019-10-21 MED FILL — AMLODIPINE BESYLATE 5 MG TA: 5 | 30 days supply | Qty: 30 | Fill #1

## 2019-10-21 MED FILL — ?ROSUVASTATIN CALCIUM 20MG: 20 | 30 days supply | Qty: 30 | Fill #1

## 2019-10-21 NOTE — Progress Notes (Signed)
Patient was informed of COVID results via [facility] on 10/16/2019

## 2019-12-21 MED FILL — AMLODIPINE BESYLATE 5 MG TA: 5 | 30 days supply | Qty: 30 | Fill #2

## 2019-12-21 MED FILL — LISINOPRIL 20 MG TABLET: 20 | 30 days supply | Qty: 30 | Fill #6

## 2019-12-21 MED FILL — ROSUVASTATIN CALCIUM 20 MG: 20 | 30 days supply | Qty: 30 | Fill #2

## 2020-01-25 ENCOUNTER — Other Ambulatory Visit: Payer: Self-pay | Admitting: Family Medicine

## 2020-01-25 DIAGNOSIS — I10 Essential (primary) hypertension: Secondary | ICD-10-CM

## 2020-01-25 MED FILL — ?ROSUVASTATIN CALC 20MG TA: 20 | 30 days supply | Qty: 30 | Fill #3

## 2020-01-25 MED FILL — AMLODIPINE BESYLATE 5 MG TA: 5 | 30 days supply | Qty: 30 | Fill #3

## 2020-01-26 MED FILL — LISINOPRIL 20 MG TABLET: 20 | 30 days supply | Qty: 30 | Fill #0

## 2020-03-22 ENCOUNTER — Other Ambulatory Visit: Payer: Self-pay | Admitting: Family Medicine

## 2020-03-22 DIAGNOSIS — I10 Essential (primary) hypertension: Secondary | ICD-10-CM

## 2020-03-22 MED FILL — ?AMLODIPINE BESYLATE 5MG TA: 5 | 30 days supply | Qty: 30 | Fill #4

## 2020-03-22 MED FILL — ROSUVASTATIN CALCIUM 20 MG: 20 | 30 days supply | Qty: 30 | Fill #4

## 2020-04-13 ENCOUNTER — Ambulatory Visit (HOSPITAL_BASED_OUTPATIENT_CLINIC_OR_DEPARTMENT_OTHER): Payer: Self-pay | Admitting: Licensed Clinical Social Worker

## 2020-04-13 ENCOUNTER — Other Ambulatory Visit: Payer: Self-pay

## 2020-04-13 ENCOUNTER — Ambulatory Visit: Payer: Self-pay | Attending: Family Medicine | Admitting: Physician Assistant

## 2020-04-13 VITALS — BP 125/80 | HR 66 | Temp 97.7°F | Ht 69.0 in | Wt 192.0 lb

## 2020-04-13 DIAGNOSIS — E782 Mixed hyperlipidemia: Secondary | ICD-10-CM

## 2020-04-13 DIAGNOSIS — F32A Depression, unspecified: Secondary | ICD-10-CM

## 2020-04-13 DIAGNOSIS — F329 Major depressive disorder, single episode, unspecified: Secondary | ICD-10-CM

## 2020-04-13 DIAGNOSIS — R5383 Other fatigue: Secondary | ICD-10-CM

## 2020-04-13 DIAGNOSIS — E559 Vitamin D deficiency, unspecified: Secondary | ICD-10-CM

## 2020-04-13 DIAGNOSIS — I10 Essential (primary) hypertension: Secondary | ICD-10-CM

## 2020-04-13 MED ORDER — ROSUVASTATIN CALCIUM 20 MG PO TABS: 20.0000 mg | ORAL_TABLET | Freq: Every day | ORAL | 5 refills | Status: DC

## 2020-04-13 MED ORDER — LISINOPRIL 20 MG PO TABS: 20.0000 mg | ORAL_TABLET | Freq: Every day | ORAL | 5 refills | Status: DC

## 2020-04-13 MED ORDER — AMLODIPINE BESYLATE 5 MG PO TABS: 5.0000 mg | ORAL_TABLET | Freq: Every day | ORAL | 5 refills | Status: DC

## 2020-04-13 MED FILL — LISINOPRIL 20 MG TABLET: 20 | 30 days supply | Qty: 30 | Fill #0

## 2020-04-13 NOTE — Patient Instructions (Signed)
Living With Depression Everyone experiences occasional disappointment, sadness, and loss in their lives. When you are feeling down, blue, or sad for at least 2 weeks in a row, it may mean that you have depression. Depression can affect your thoughts and feelings, relationships, daily activities, and physical health. It is caused by changes in the way your brain functions. If you receive a diagnosis of depression, your health care provider will tell you which type of depression you have and what treatment options are available to you. If you are living with depression, there are ways to help you recover from it and also ways to prevent it from coming back. How to cope with lifestyle changes Coping with stress     Stress is your body's reaction to life changes and events, both good and bad. Stressful situations may include:  Getting married.  The death of a spouse.  Losing a job.  Retiring.  Having a baby. Stress can last just a few hours or it can be ongoing. Stress can play a major role in depression, so it is important to learn both how to cope with stress and how to think about it differently. Talk with your health care provider or a counselor if you would like to learn more about stress reduction. He or she may suggest some stress reduction techniques, such as:  Music therapy. This can include creating music or listening to music. Choose music that you enjoy and that inspires you.  Mindfulness-based meditation. This kind of meditation can be done while sitting or walking. It involves being aware of your normal breaths, rather than trying to control your breathing.  Centering prayer. This is a kind of meditation that involves focusing on a spiritual word or phrase. Choose a word, phrase, or sacred image that is meaningful to you and that brings you peace.  Deep breathing. To do this, expand your stomach and inhale slowly through your nose. Hold your breath for 3-5 seconds, then exhale  slowly, allowing your stomach muscles to relax.  Muscle relaxation. This involves intentionally tensing muscles then relaxing them. Choose a stress reduction technique that fits your lifestyle and personality. Stress reduction techniques take time and practice to develop. Set aside 5-15 minutes a day to do them. Therapists can offer training in these techniques. The training may be covered by some insurance plans. Other things you can do to manage stress include:  Keeping a stress diary. This can help you learn what triggers your stress and ways to control your response.  Understanding what your limits are and saying no to requests or events that lead to a schedule that is too full.  Thinking about how you respond to certain situations. You may not be able to control everything, but you can control how you react.  Adding humor to your life by watching funny films or TV shows.  Making time for activities that help you relax and not feeling guilty about spending your time this way.  Medicines Your health care provider may suggest certain medicines if he or she feels that they will help improve your condition. Avoid using alcohol and other substances that may prevent your medicines from working properly (may interact). It is also important to:  Talk with your pharmacist or health care provider about all the medicines that you take, their possible side effects, and what medicines are safe to take together.  Make it your goal to take part in all treatment decisions (shared decision-making). This includes giving input on   the side effects of medicines. It is best if shared decision-making with your health care provider is part of your total treatment plan. If your health care provider prescribes a medicine, you may not notice the full benefits of it for 4-8 weeks. Most people who are treated for depression need to be on medicine for at least 6-12 months after they feel better. If you are taking  medicines as part of your treatment, do not stop taking medicines without first talking to your health care provider. You may need to have the medicine slowly decreased (tapered) over time to decrease the risk of harmful side effects. Relationships Your health care provider may suggest family therapy along with individual therapy and drug therapy. While there may not be family problems that are causing you to feel depressed, it is still important to make sure your family learns as much as they can about your mental health. Having your family's support can help make your treatment successful. How to recognize changes in your condition Everyone has a different response to treatment for depression. Recovery from major depression happens when you have not had signs of major depression for two months. This may mean that you will start to:  Have more interest in doing activities.  Feel less hopeless than you did 2 months ago.  Have more energy.  Overeat less often, or have better or improving appetite.  Have better concentration. Your health care provider will work with you to decide the next steps in your recovery. It is also important to recognize when your condition is getting worse. Watch for these signs:  Having fatigue or low energy.  Eating too much or too little.  Sleeping too much or too little.  Feeling restless, agitated, or hopeless.  Having trouble concentrating or making decisions.  Having unexplained physical complaints.  Feeling irritable, angry, or aggressive. Get help as soon as you or your family members notice these symptoms coming back. How to get support and help from others How to talk with friends and family members about your condition  Talking to friends and family members about your condition can provide you with one way to get support and guidance. Reach out to trusted friends or family members, explain your symptoms to them, and let them know that you are  working with a health care provider to treat your depression. Financial resources Not all insurance plans cover mental health care, so it is important to check with your insurance carrier. If paying for co-pays or counseling services is a problem, search for a local or county mental health care center. They may be able to offer public mental health care services at low or no cost when you are not able to see a private health care provider. If you are taking medicine for depression, you may be able to get the generic form, which may be less expensive. Some makers of prescription medicines also offer help to patients who cannot afford the medicines they need. Follow these instructions at home:   Get the right amount and quality of sleep.  Cut down on using caffeine, tobacco, alcohol, and other potentially harmful substances.  Try to exercise, such as walking or lifting small weights.  Take over-the-counter and prescription medicines only as told by your health care provider.  Eat a healthy diet that includes plenty of vegetables, fruits, whole grains, low-fat dairy products, and lean protein. Do not eat a lot of foods that are high in solid fats, added sugars, or salt.    Keep all follow-up visits as told by your health care provider. This is important. Contact a health care provider if:  You stop taking your antidepressant medicines, and you have any of these symptoms: ? Nausea. ? Headache. ? Feeling lightheaded. ? Chills and body aches. ? Not being able to sleep (insomnia).  You or your friends and family think your depression is getting worse. Get help right away if:  You have thoughts of hurting yourself or others. If you ever feel like you may hurt yourself or others, or have thoughts about taking your own life, get help right away. You can go to your nearest emergency department or call:  Your local emergency services (911 in the [place]  A suicide crisis helpline, such as the  National Suicide Prevention Lifeline at 1-800-273-8255. This is open 24-hours a day. Summary  If you are living with depression, there are ways to help you recover from it and also ways to prevent it from coming back.  Work with your health care team to create a management plan that includes counseling, stress management techniques, and healthy lifestyle habits. This information is not intended to replace advice given to you by your health care provider. Make sure you discuss any questions you have with your health care provider. Document Revised: 02/06/2019 Document Reviewed: 09/17/2016 Elsevier Patient Education  2020 Elsevier Inc.  

## 2020-04-13 NOTE — Progress Notes (Signed)
Jason Pacheco, is a 54 y.o. male  WNU:[id number]  IHK:[id number]  DOB - 1965/11/12  Subjective:  Chief Complaint and HPI: Jason Pacheco is a 54 y.o. male here today for med RF and depression check up.  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  He has felt extremely tired and hasn't wanted to leave his house since then.  He says he drinks ample water daily.  He denies SI/HI.  He says these episodes of depression usu pass. He has not been on meds for depression. Otherwise doing well.  No CP/SOB/HA/dizziness.  Compliant with meds.  ROS:   Constitutional:  No f/c, No night sweats, No unexplained weight loss. EENT:  No vision changes, No blurry vision, No hearing changes. No mouth, throat, or ear problems.  Respiratory: No cough, No SOB Cardiac: No CP, no palpitations GI:  No abd pain, No N/V/D. GU: No Urinary s/sx Musculoskeletal: No joint pain Neuro: No headache, no dizziness, no motor weakness.  Skin: No rash Endocrine:  No polydipsia. No polyuria.  Psych: Denies SI/HI  No problems updated.  ALLERGIES: No Known Allergies  PAST MEDICAL HISTORY: Past Medical History:  Diagnosis Date   Hypertension     MEDICATIONS AT HOME: Prior to Admission medications   Medication Sig Start Date End Date Taking? Authorizing Provider  amLODipine (NORVASC) 5 MG tablet Take 1 tablet (5 mg total) by mouth daily. 04/13/20  Yes Jason Pacheco  lisinopril (ZESTRIL) 20 MG tablet Take 1 tablet (20 mg total) by mouth daily. 04/13/20  Yes Jason Pacheco  predniSONE (DELTASONE) 20 MG tablet Take 1 tablet (20 mg total) by mouth 2 (two) times daily with a meal. 04/07/19  Yes Jason Pacheco  rosuvastatin (CRESTOR) 20 MG tablet Take 1 tablet (20 mg total) by mouth daily. 04/13/20  Yes Jason Pacheco  aspirin EC 81 MG tablet Take 1 tablet (81 mg total) by mouth  daily. Patient not taking: Reported on 12/03/2017 03/11/17   Alfonse Spruce, FNP  ibuprofen (ADVIL,MOTRIN) 600 MG tablet Take 1 tablet (600 mg total) by mouth every 8 (eight) hours as needed for moderate pain. Patient not taking: Reported on 12/03/2017 01/23/17   Alfonse Spruce, FNP  naproxen (NAPROSYN) 500 MG tablet Take 1 tablet (500 mg total) by mouth every 12 (twelve) hours as needed for mild pain or moderate pain. Take with food. Patient not taking: Reported on 12/30/2018 12/03/17   Alfonse Spruce, FNP  Vitamin D, Ergocalciferol, (DRISDOL) 50000 units CAPS capsule TAKE ONE TABLE BY MOUTH ONCE A WEEK. FOR 12 WEEKS. Patient not taking: Reported on 12/30/2018 12/09/17   Alfonse Spruce, FNP     Objective:  EXAM:   Vitals:   04/13/20 0902  BP: 125/80  Pulse: 66  Temp: 97.7 F (36.5 C)  TempSrc: Temporal  SpO2: 99%  Weight: 192 lb (87.1 kg)  Height: 5\' 9"  (1.753 m)    General appearance : A&OX3. NAD. Non-toxic-appearing HEENT: Atraumatic and Normocephalic.  PERRLA. EOM intact.  Chest/Lungs:  Breathing-non-labored, Good air entry bilaterally, breath sounds normal without rales, rhonchi, or wheezing  CVS: S1 S2 regular, no murmurs, gallops, rubs  Extremities: Bilateral Lower Ext shows no edema, both legs are warm to touch with = pulse throughout Neurology:  CN II-XII grossly intact, Non focal.   Psych:  TP linear. J/I WNL. Normal speech. Appropriate eye contact and affect.  Skin:  No Rash  Depression screen [facility] 2/9 04/13/2020 12/30/2018 03/03/2018  Decreased Interest 2 0 0  Down, Depressed, Hopeless 1 0 0  PHQ - 2 Score 3 0 0  Altered sleeping 1 0 0  Tired, decreased energy 1 0 0  Change in appetite 0 0 0  Feeling bad or failure about yourself  1 0 0  Trouble concentrating 1 0 0  Moving slowly or fidgety/restless 0 0 0  Suicidal thoughts 0 0 0  PHQ-9 Score 7 0 0     Data Review No results found for: HGBA1C   Assessment & Plan   1. Essential  hypertension Controlled-continue current regimen - amLODipine (NORVASC) 5 MG tablet; Take 1 tablet (5 mg total) by mouth daily.  Dispense: 30 tablet; Refill: 5 - lisinopril (ZESTRIL) 20 MG tablet; Take 1 tablet (20 mg total) by mouth daily.  Dispense: 30 tablet; Refill: 5 - Comprehensive metabolic panel - TSH - CBC with Differential/Platelet  2. Mixed hyperlipidemia - rosuvastatin (CRESTOR) 20 MG tablet; Take 1 tablet (20 mg total) by mouth daily.  Dispense: 30 tablet; Refill: 5 - Comprehensive metabolic panel - Lipid panel  3. Fatigue, unspecified type Drink adequate water for outdoor work and eat healthy meals - TSH - CBC with Differential/Platelet  4. Vitamin D deficiency - Vitamin D, 25-hydroxy  5. Depression, unspecified depression type Counseled on self care, exercise, proper sleep and diet.  Spent >40 mins face to face counseling on all the above - Vitamin D, 25-hydroxy - Ambulatory referral to Social Work   Patient have been counseled extensively about nutrition and exercise  Return for 2 week phone visit with Jason Pacheco and Pacheco PCP in 3-4 months.  The patient was given clear instructions to go to ER or return to medical center if symptoms don't improve, worsen or new problems develop. The patient verbalized understanding. The patient was told to call to get lab results if they haven't heard anything in the next week.     Jason Caldron, Pacheco [facility] and [place], [place]   04/13/2020, 9:23 AMPatient ID: [name], male   DOB: [birth year], 54 y.o.   MRN: [id number]

## 2020-04-15 ENCOUNTER — Other Ambulatory Visit: Payer: Self-pay | Admitting: Physician Assistant

## 2020-04-15 DIAGNOSIS — E559 Vitamin D deficiency, unspecified: Secondary | ICD-10-CM

## 2020-04-15 LAB — LIPID PANEL
Chol/HDL Ratio: 3.1 ratio (ref 0.0–5.0)
Cholesterol, Total: 161 mg/dL (ref 100–199)
HDL: 52 mg/dL (ref >39)
LDL Chol Calc (NIH): 93 mg/dL (ref 0–99)
Triglycerides: 84 mg/dL (ref 0–149)
VLDL Cholesterol Cal: 16 mg/dL (ref 5–40)

## 2020-04-15 LAB — CBC WITH DIFFERENTIAL/PLATELET
Basophils Absolute: 0 10*3/uL (ref 0.0–0.2)
Basos: 1 %
EOS (ABSOLUTE): 0.1 10*3/uL (ref 0.0–0.4)
Eos: 2 %
Hematocrit: 48.2 % (ref 37.5–51.0)
Hemoglobin: 15.9 g/dL (ref 13.0–17.7)
Immature Grans (Abs): 0 10*3/uL (ref 0.0–0.1)
Immature Granulocytes: 0 %
Lymphocytes Absolute: 1.4 10*3/uL (ref 0.7–3.1)
Lymphs: 31 %
MCH: 27.7 pg (ref 26.6–33.0)
MCHC: 33 g/dL (ref 31.5–35.7)
MCV: 84 fL (ref 79–97)
Monocytes Absolute: 0.6 10*3/uL (ref 0.1–0.9)
Monocytes: 12 %
Neutrophils Absolute: 2.4 10*3/uL (ref 1.4–7.0)
Neutrophils: 54 %
Platelets: 197 10*3/uL (ref 150–450)
RBC: 5.73 x10E6/uL (ref 4.14–5.80)
RDW: 13.6 % (ref 11.6–15.4)
WBC: 4.5 10*3/uL (ref 3.4–10.8)

## 2020-04-15 LAB — COMPREHENSIVE METABOLIC PANEL
ALT: 33 IU/L (ref 0–44)
AST: 22 IU/L (ref 0–40)
Albumin/Globulin Ratio: 1.7 (ref 1.2–2.2)
Albumin: 4.8 g/dL (ref 3.8–4.9)
Alkaline Phosphatase: 69 IU/L (ref 48–121)
BUN/Creatinine Ratio: 12 (ref 9–20)
BUN: 16 mg/dL (ref 6–24)
Bilirubin Total: 1 mg/dL (ref 0.0–1.2)
CO2: 20 mmol/L (ref 20–29)
Calcium: 9.8 mg/dL (ref 8.7–10.2)
Chloride: 105 mmol/L (ref 96–106)
Creatinine, Ser: 1.36 mg/dL — ABNORMAL HIGH (ref 0.76–1.27)
GFR calc Af Amer: 67 mL/min/{1.73_m2} (ref >59)
GFR calc non Af Amer: 58 mL/min/{1.73_m2} — ABNORMAL LOW (ref >59)
Globulin, Total: 2.9 g/dL (ref 1.5–4.5)
Glucose: 96 mg/dL (ref 65–99)
Potassium: 4.2 mmol/L (ref 3.5–5.2)
Sodium: 143 mmol/L (ref 134–144)
Total Protein: 7.7 g/dL (ref 6.0–8.5)

## 2020-04-15 LAB — VITAMIN D 25 HYDROXY (VIT D DEFICIENCY, FRACTURES): Vit D, 25-Hydroxy: 22.1 ng/mL — ABNORMAL LOW (ref 30.0–100.0)

## 2020-04-15 LAB — TSH: TSH: 1.53 u[IU]/mL (ref 0.450–4.500)

## 2020-04-15 MED ORDER — VITAMIN D (ERGOCALCIFEROL) 1.25 MG (50000 UNIT) PO CAPS: ORAL_CAPSULE | ORAL | 0 refills | Status: AC

## 2020-04-15 MED FILL — VIT D2 1.25 MG (50,000 UNIT: 1.25 MG | 84 days supply | Qty: 12 | Fill #0

## 2020-04-20 ENCOUNTER — Telehealth: Payer: Self-pay | Admitting: Pharmacist

## 2020-04-20 ENCOUNTER — Telehealth: Payer: Self-pay | Admitting: Licensed Clinical Social Worker

## 2020-04-20 ENCOUNTER — Ambulatory Visit: Payer: Self-pay | Attending: Family Medicine | Admitting: Licensed Clinical Social Worker

## 2020-04-20 ENCOUNTER — Other Ambulatory Visit: Payer: Self-pay

## 2020-04-20 NOTE — BH Specialist Note (Signed)
Integrated Behavioral Health Initial Visit  MRN: [id number] Name: Dillinger Aston  Number of [place] Clinician visits:: 1/6 Session Start time: 9:20 AM  Session End time: 9:35 AM Total time: 15  Type of Service: [place] Interpretor:No. Interpretor Name and Language: NA   Warm Hand Off Completed.       SUBJECTIVE: Jason Pacheco is a 54 y.o. male accompanied by self Patient was referred by PA- C McClung for depression. Patient reports the following symptoms/concerns: Pt reports increase in depression symptoms triggered by psychosocial stressors. Pt is homeless and residing with friends Duration of problem: Ongoing; Severity of problem: moderate  OBJECTIVE: Mood: Appropriate and Affect: Appropriate Risk of harm to self or others: No plan to harm self or others  LIFE CONTEXT: Family and Social: Pt resides with friends School/Work: Pt has anxiety about returning to work after an episode where he "overheated" Self-Care: Pt was successful in identifying healthy coping skills Life Changes: Pt experiencing psychosocial stressors  GOALS ADDRESSED: Patient will: 1. Reduce symptoms of: anxiety and depression 2. Increase knowledge and/or ability of: coping skills and healthy habits  3. Demonstrate ability to: Increase healthy adjustment to current life circumstances and Increase adequate support systems for patient/family  INTERVENTIONS: Interventions utilized: Solution-Focused Strategies, Supportive Counseling and Psychoeducation and/or Health Education  Standardized Assessments completed: GAD-7 and PHQ 2&9  ASSESSMENT: Patient currently experiencing increase in depression and anxiety symptoms triggered by psychosocial stressors.   Patient may benefit from medication management and therapy. Healthy coping skills were discussed to assist with management and/or decrease of symptoms. Pt is interested in applying for Financial Counseling  to assist with medical bills.   PLAN: 1. Follow up with behavioral health clinician on : 04/20/2020 2. Behavioral recommendations: Utilize strategies discussed 3. Referral(s): [place] (In Clinic) 4. "From scale of 1-10, how likely are you to follow plan?":   Rebekah Chesterfield, LCSW 04/20/2020 10:17 AM

## 2020-04-20 NOTE — Telephone Encounter (Signed)
LCSW placed call to patient regarding scheduled IBH appointment. LCSW left message requesting a return call.

## 2020-04-22 ENCOUNTER — Encounter (INDEPENDENT_AMBULATORY_CARE_PROVIDER_SITE_OTHER): Payer: Self-pay | Admitting: *Deleted

## 2020-06-13 MED FILL — ?AMLODIPINE BESYLATE 5MG TA: 5 | 30 days supply | Qty: 30 | Fill #0

## 2020-06-13 MED FILL — ROSUVASTATIN CALCIUM 20 MG: 20 | 30 days supply | Qty: 30 | Fill #0

## 2020-06-13 MED FILL — LISINOPRIL 20 MG TABLET: 20 | 30 days supply | Qty: 30 | Fill #1

## 2020-07-14 ENCOUNTER — Other Ambulatory Visit: Payer: Self-pay | Admitting: Family Medicine

## 2020-07-14 ENCOUNTER — Encounter: Payer: Self-pay | Admitting: Family Medicine

## 2020-07-14 ENCOUNTER — Other Ambulatory Visit: Payer: Self-pay

## 2020-07-14 ENCOUNTER — Ambulatory Visit: Payer: Self-pay | Attending: Family Medicine | Admitting: Family Medicine

## 2020-07-14 VITALS — BP 145/83 | HR 69 | Ht 69.0 in | Wt 197.0 lb

## 2020-07-14 DIAGNOSIS — I10 Essential (primary) hypertension: Secondary | ICD-10-CM

## 2020-07-14 DIAGNOSIS — E782 Mixed hyperlipidemia: Secondary | ICD-10-CM

## 2020-07-14 DIAGNOSIS — G5603 Carpal tunnel syndrome, bilateral upper limbs: Secondary | ICD-10-CM

## 2020-07-14 DIAGNOSIS — Z720 Tobacco use: Secondary | ICD-10-CM

## 2020-07-14 DIAGNOSIS — Z1211 Encounter for screening for malignant neoplasm of colon: Secondary | ICD-10-CM

## 2020-07-14 MED ORDER — LISINOPRIL 20 MG PO TABS: 20.0000 mg | ORAL_TABLET | Freq: Every day | ORAL | 6 refills | Status: DC

## 2020-07-14 MED ORDER — AMLODIPINE BESYLATE 5 MG PO TABS: 5.0000 mg | ORAL_TABLET | Freq: Every day | ORAL | 6 refills | Status: DC

## 2020-07-14 MED ORDER — ROSUVASTATIN CALCIUM 20 MG PO TABS: 20.0000 mg | ORAL_TABLET | Freq: Every day | ORAL | 6 refills | Status: DC

## 2020-07-14 MED FILL — LISINOPRIL 20 MG TABLET: 20 | 30 days supply | Qty: 30 | Fill #0

## 2020-07-14 MED FILL — ROSUVASTATIN CALCIUM 20 MG: 20 | 30 days supply | Qty: 30 | Fill #0

## 2020-07-14 MED FILL — AMLODIPINE BESYLATE 5 MG TA: 5 | 30 days supply | Qty: 30 | Fill #0

## 2020-07-14 NOTE — Progress Notes (Signed)
Subjective:  Patient ID: Jason Pacheco, male    DOB: Apr 04, 1966  Age: 54 y.o. MRN: [id number]  CC: Hypertension   HPI Jason Pacheco is a 54 year old male with a history of hypertension, hyperlipidemia, tobacco abuse who presents today for a follow-up visit. Bp is slightly elevated but he is yet to take his antihypertensives and just smoked a cig. Not ready to quit yet and smokes 7 - 14 cig/day  He has noticed numbness in the thumb, index and middle finger mostly on the left hand and symptoms are worse at night.  He is right-hand dominant. Compliant with his statin. He gets his exercise mostly from his job which is very physical.  Past Medical History:  Diagnosis Date   Hypertension     History reviewed. No pertinent surgical history.  Family History  Problem Relation Age of Onset   Hypertension Mother    Heart disease Mother     No Known Allergies  Outpatient Medications Prior to Visit  Medication Sig Dispense Refill   Vitamin D, Ergocalciferol, (DRISDOL) 1.25 MG (50000 UNIT) CAPS capsule TAKE ONE TABLE BY MOUTH ONCE A WEEK. FOR 12 WEEKS. 12 capsule 0   amLODipine (NORVASC) 5 MG tablet Take 1 tablet (5 mg total) by mouth daily. 30 tablet 5   lisinopril (ZESTRIL) 20 MG tablet Take 1 tablet (20 mg total) by mouth daily. 30 tablet 5   rosuvastatin (CRESTOR) 20 MG tablet Take 1 tablet (20 mg total) by mouth daily. 30 tablet 5   aspirin EC 81 MG tablet Take 1 tablet (81 mg total) by mouth daily. (Patient not taking: Reported on 12/03/2017) 30 tablet 2   No facility-administered medications prior to visit.     ROS Review of Systems  Constitutional: Negative for activity change and appetite change.  HENT: Negative for sinus pressure and sore throat.   Eyes: Negative for visual disturbance.  Respiratory: Negative for cough, chest tightness and shortness of breath.   Cardiovascular: Negative for chest pain and leg swelling.  Gastrointestinal: Negative for abdominal  distention, abdominal pain, constipation and diarrhea.  Endocrine: Negative.   Genitourinary: Negative for dysuria.  Musculoskeletal: Negative for joint swelling and myalgias.  Skin: Negative for rash.  Allergic/Immunologic: Negative.   Neurological: Positive for numbness. Negative for weakness and light-headedness.  Psychiatric/Behavioral: Negative for dysphoric mood and suicidal ideas.    Objective:  BP (!) 145/83    Pulse 69    Ht 5\' 9"  (1.753 m)    Wt 197 lb (89.4 kg)    SpO2 99%    BMI 29.09 kg/m   BP/Weight 07/14/2020 4/58/0998 12/29/8248  Systolic BP 539 767 341  Diastolic BP 83 80 73  Wt. (Lbs) 197 192 194  BMI 29.09 28.35 28.65      Physical Exam Constitutional:      Appearance: He is well-developed.  Neck:     Vascular: No JVD.  Cardiovascular:     Rate and Rhythm: Normal rate.     Heart sounds: Normal heart sounds. No murmur heard.   Pulmonary:     Effort: Pulmonary effort is normal.     Breath sounds: Normal breath sounds. No wheezing or rales.  Chest:     Chest wall: No tenderness.  Abdominal:     General: Bowel sounds are normal. There is no distension.     Palpations: Abdomen is soft. There is no mass.     Tenderness: There is no abdominal tenderness.  Musculoskeletal:  General: Normal range of motion.     Right lower leg: No edema.     Left lower leg: No edema.     Comments: Negative Tinel sign and phalen's signs bilateral  Neurological:     Mental Status: He is alert and oriented to person, place, and time.  Psychiatric:        Mood and Affect: Mood normal.     CMP Latest Ref Rng & Units 04/13/2020 04/07/2019 12/03/2017  Glucose 65 - 99 mg/dL 96 92 82  BUN 6 - 24 mg/dL 16 12 14   Creatinine 0.76 - 1.27 mg/dL 1.36(H) 1.12 1.09  Sodium 134 - 144 mmol/L 143 141 143  Potassium 3.5 - 5.2 mmol/L 4.2 4.4 4.1  Chloride 96 - 106 mmol/L 105 106 109(H)  CO2 20 - 29 mmol/L 20 20 19(L)  Calcium 8.7 - 10.2 mg/dL 9.8 9.3 9.4  Total Protein 6.0 - 8.5 g/dL  7.7 7.0 7.7  Total Bilirubin 0.0 - 1.2 mg/dL 1.0 0.8 0.8  Alkaline Phos 48 - 121 IU/L 69 59 55  AST 0 - 40 IU/L 22 35 25  ALT 0 - 44 IU/L 33 44 25    Lipid Panel     Component Value Date/Time   CHOL 161 04/13/2020 0943   TRIG 84 04/13/2020 0943   HDL 52 04/13/2020 0943   CHOLHDL 3.1 04/13/2020 0943   CHOLHDL 3.2 01/09/2017 1707   VLDL 19 01/09/2017 1707   LDLCALC 93 04/13/2020 0943    CBC    Component Value Date/Time   WBC 4.5 04/13/2020 0943   WBC 5.5 12/18/2015 0308   RBC 5.73 04/13/2020 0943   RBC 4.70 12/18/2015 0308   HGB 15.9 04/13/2020 0943   HCT 48.2 04/13/2020 0943   PLT 197 04/13/2020 0943   MCV 84 04/13/2020 0943   MCH 27.7 04/13/2020 0943   MCH 28.7 12/18/2015 0308   MCHC 33.0 04/13/2020 0943   MCHC 32.8 12/18/2015 0308   RDW 13.6 04/13/2020 0943   LYMPHSABS 1.4 04/13/2020 0943   MONOABS 0.5 12/17/2015 1556   EOSABS 0.1 04/13/2020 0943   BASOSABS 0.0 04/13/2020 0943    No results found for: HGBA1C  Assessment & Plan:  1. Essential hypertension Slightly above goal No regimen change today as he just had a cig Prior to scheduled visit next Counseled on blood pressure goal of less than 130/80, low-sodium, DASH diet, medication compliance, 150 minutes of moderate intensity exercise per week. Discussed medication compliance, adverse effects. - amLODipine (NORVASC) 5 MG tablet; Take 1 tablet (5 mg total) by mouth daily.  Dispense: 30 tablet; Refill: 6 - lisinopril (ZESTRIL) 20 MG tablet; Take 1 tablet (20 mg total) by mouth daily.  Dispense: 30 tablet; Refill: 6  2. Mixed hyperlipidemia Controlled Low-cholesterol diet - rosuvastatin (CRESTOR) 20 MG tablet; Take 1 tablet (20 mg total) by mouth daily.  Dispense: 30 tablet; Refill: 6  3. Screening for colon cancer - Fecal occult blood, imunochemical([facility])  4. Bilateral carpal tunnel syndrome Advised to use wrist splints at night Use OTC NSAIDs  5. Tobacco abuse Spent 3 minutes counseling  on smoking cessation and he is not ready to quit at this time    Meds ordered this encounter  Medications   amLODipine (NORVASC) 5 MG tablet    Sig: Take 1 tablet (5 mg total) by mouth daily.    Dispense:  30 tablet    Refill:  6   lisinopril (ZESTRIL) 20 MG tablet    Sig:  Take 1 tablet (20 mg total) by mouth daily.    Dispense:  30 tablet    Refill:  6   rosuvastatin (CRESTOR) 20 MG tablet    Sig: Take 1 tablet (20 mg total) by mouth daily.    Dispense:  30 tablet    Refill:  6    Follow-up: Return in about 6 months (around 01/11/2021) for chronic disease management.       Charlott Rakes, MD, FAAFP. [facility] [place], [place]   07/14/2020, 12:28 PM

## 2020-07-14 NOTE — Patient Instructions (Signed)
Carpal Tunnel Syndrome  Carpal tunnel syndrome is a condition that causes pain in your hand and arm. The carpal tunnel is a narrow area that is on the palm side of your wrist. Repeated wrist motion or certain diseases may cause swelling in the tunnel. This swelling can pinch the main nerve in the wrist (median nerve). What are the causes? This condition may be caused by:  Repeated wrist motions.  Wrist injuries.  Arthritis.  A sac of fluid (cyst) or abnormal growth (tumor) in the carpal tunnel.  Fluid buildup during pregnancy. Sometimes the cause is not known. What increases the risk? The following factors may make you more likely to develop this condition:  Having a job in which you move your wrist in the same way many times. This includes jobs like being a butcher or a cashier.  Being a woman.  Having other health conditions, such as: ? Diabetes. ? Obesity. ? A thyroid gland that is not active enough (hypothyroidism). ? Kidney failure. What are the signs or symptoms? Symptoms of this condition include:  A tingling feeling in your fingers.  Tingling or a loss of feeling (numbness) in your hand.  Pain in your entire arm. This pain may get worse when you bend your wrist and elbow for a long time.  Pain in your wrist that goes up your arm to your shoulder.  Pain that goes down into your palm or fingers.  A weak feeling in your hands. You may find it hard to grab and hold items. You may feel worse at night. How is this diagnosed? This condition is diagnosed with a medical history and physical exam. You may also have tests, such as:  Electromyogram (EMG). This test checks the signals that the nerves send to the muscles.  Nerve conduction study. This test checks how well signals pass through your nerves.  Imaging tests, such as X-rays, ultrasound, and MRI. These tests check for what might be the cause of your condition. How is this treated? This condition may be treated  with:  Lifestyle changes. You will be asked to stop or change the activity that caused your problem.  Doing exercise and activities that make bones and muscles stronger (physical therapy).  Learning how to use your hand again (occupational therapy).  Medicines for pain and swelling (inflammation). You may have injections in your wrist.  A wrist splint.  Surgery. Follow these instructions at home: If you have a splint:  Wear the splint as told by your doctor. Remove it only as told by your doctor.  Loosen the splint if your fingers: ? Tingle. ? Lose feeling (become numb). ? Turn cold and blue.  Keep the splint clean.  If the splint is not waterproof: ? Do not let it get wet. ? Cover it with a watertight covering when you take a bath or a shower. Managing pain, stiffness, and swelling   If told, put ice on the painful area: ? If you have a removable splint, remove it as told by your doctor. ? Put ice in a plastic bag. ? Place a towel between your skin and the bag. ? Leave the ice on for 20 minutes, 2-3 times per day. General instructions  Take over-the-counter and prescription medicines only as told by your doctor.  Rest your wrist from any activity that may cause pain. If needed, talk with your boss at work about changes that can help your wrist heal.  Do any exercises as told by your doctor,   physical therapist, or occupational therapist.  Keep all follow-up visits as told by your doctor. This is important. Contact a doctor if:  You have new symptoms.  Medicine does not help your pain.  Your symptoms get worse. Get help right away if:  You have very bad numbness or tingling in your wrist or hand. Summary  Carpal tunnel syndrome is a condition that causes pain in your hand and arm.  It is often caused by repeated wrist motions.  Lifestyle changes and medicines are used to treat this problem. Surgery may help in very bad cases.  Follow your doctor's  instructions about wearing a splint, resting your wrist, keeping follow-up visits, and calling for help. This information is not intended to replace advice given to you by your health care provider. Make sure you discuss any questions you have with your health care provider. Document Revised: 02/21/2018 Document Reviewed: 02/21/2018 Elsevier Patient Education  2020 [employer] Inc.  

## 2020-08-15 MED FILL — LISINOPRIL 20 MG TABLET: 20 | 30 days supply | Qty: 30 | Fill #1

## 2020-08-15 MED FILL — ROSUVASTATIN CALCIUM 20 MG: 20 | 30 days supply | Qty: 30 | Fill #1

## 2020-08-15 MED FILL — AMLODIPINE BESYLATE 5 MG TA: 5 | 30 days supply | Qty: 30 | Fill #1

## 2020-10-17 MED FILL — AMLODIPINE BESYLATE 5 MG TA: 5 | 30 days supply | Qty: 30 | Fill #2

## 2020-10-17 MED FILL — ROSUVASTATIN CALCIUM 20 MG: 20 | 30 days supply | Qty: 30 | Fill #2

## 2020-10-17 MED FILL — LISINOPRIL 20 MG TABLET: 20 | 30 days supply | Qty: 30 | Fill #2

## 2020-12-07 MED FILL — AMLODIPINE BESYLATE 5 MG TA: 5 | 30 days supply | Qty: 30 | Fill #3

## 2020-12-07 MED FILL — ROSUVASTATIN CALCIUM 20 MG: 20 | 30 days supply | Qty: 30 | Fill #3

## 2020-12-07 MED FILL — LISINOPRIL 20 MG TABLET: 20 | 30 days supply | Qty: 30 | Fill #3

## 2021-02-14 ENCOUNTER — Other Ambulatory Visit: Payer: Self-pay

## 2021-02-14 MED FILL — Lisinopril Tab 20 MG: ORAL | 30 days supply | Qty: 30 | Fill #0 | Status: AC

## 2021-02-14 MED FILL — Rosuvastatin Calcium Tab 20 MG: ORAL | 30 days supply | Qty: 30 | Fill #0 | Status: AC

## 2021-02-14 MED FILL — Amlodipine Besylate Tab 5 MG (Base Equivalent): ORAL | 30 days supply | Qty: 30 | Fill #0 | Status: AC

## 2021-02-15 ENCOUNTER — Other Ambulatory Visit: Payer: Self-pay

## 2021-04-09 MED FILL — Rosuvastatin Calcium Tab 20 MG: ORAL | 30 days supply | Qty: 30 | Fill #1 | Status: AC

## 2021-04-09 MED FILL — Lisinopril Tab 20 MG: ORAL | 30 days supply | Qty: 30 | Fill #1 | Status: AC

## 2021-04-09 MED FILL — Amlodipine Besylate Tab 5 MG (Base Equivalent): ORAL | 30 days supply | Qty: 30 | Fill #1 | Status: AC

## 2021-04-10 ENCOUNTER — Other Ambulatory Visit: Payer: Self-pay

## 2021-06-06 MED FILL — Rosuvastatin Calcium Tab 20 MG: ORAL | 30 days supply | Qty: 30 | Fill #2 | Status: AC

## 2021-06-06 MED FILL — Lisinopril Tab 20 MG: ORAL | 30 days supply | Qty: 30 | Fill #2 | Status: AC

## 2021-06-06 MED FILL — Amlodipine Besylate Tab 5 MG (Base Equivalent): ORAL | 30 days supply | Qty: 30 | Fill #2 | Status: AC

## 2021-06-07 ENCOUNTER — Other Ambulatory Visit: Payer: Self-pay

## 2021-10-10 ENCOUNTER — Telehealth (INDEPENDENT_AMBULATORY_CARE_PROVIDER_SITE_OTHER): Payer: Self-pay | Admitting: Family Medicine

## 2021-10-10 NOTE — Telephone Encounter (Signed)
Dr. Newlin on vacation 12/19. Left vm for patient to call [phone] to reschedule appt.  °

## 2021-10-16 ENCOUNTER — Encounter: Payer: Self-pay | Admitting: Family Medicine

## 2021-11-22 ENCOUNTER — Ambulatory Visit: Payer: Self-pay | Attending: Family Medicine | Admitting: Family Medicine

## 2021-11-22 ENCOUNTER — Other Ambulatory Visit: Payer: Self-pay

## 2022-07-16 ENCOUNTER — Ambulatory Visit: Payer: Commercial Managed Care - HMO | Attending: Family Medicine | Admitting: Family Medicine

## 2022-07-16 ENCOUNTER — Other Ambulatory Visit: Payer: Self-pay

## 2022-07-16 ENCOUNTER — Encounter: Payer: Self-pay | Admitting: Family Medicine

## 2022-07-16 VITALS — BP 146/93 | HR 63 | Ht 69.0 in | Wt 177.0 lb

## 2022-07-16 DIAGNOSIS — R202 Paresthesia of skin: Secondary | ICD-10-CM

## 2022-07-16 DIAGNOSIS — Z1211 Encounter for screening for malignant neoplasm of colon: Secondary | ICD-10-CM

## 2022-07-16 DIAGNOSIS — I10 Essential (primary) hypertension: Secondary | ICD-10-CM

## 2022-07-16 DIAGNOSIS — Z1159 Encounter for screening for other viral diseases: Secondary | ICD-10-CM

## 2022-07-16 DIAGNOSIS — G5603 Carpal tunnel syndrome, bilateral upper limbs: Secondary | ICD-10-CM

## 2022-07-16 DIAGNOSIS — Z23 Encounter for immunization: Secondary | ICD-10-CM | POA: Diagnosis not present

## 2022-07-16 DIAGNOSIS — E782 Mixed hyperlipidemia: Secondary | ICD-10-CM | POA: Diagnosis not present

## 2022-07-16 MED ORDER — LISINOPRIL 20 MG PO TABS
20.0000 mg | ORAL_TABLET | Freq: Every day | ORAL | 1 refills | Status: DC
  Filled 2022-07-16: qty 90, 90d supply, fill #0
  Filled 2022-10-23: qty 30, 30d supply, fill #1

## 2022-07-16 MED ORDER — ROSUVASTATIN CALCIUM 20 MG PO TABS
20.0000 mg | ORAL_TABLET | Freq: Every day | ORAL | 1 refills | Status: DC
  Filled 2022-07-16: qty 87, 87d supply, fill #0
  Filled 2022-07-16: qty 3, 3d supply, fill #0

## 2022-07-16 MED ORDER — AMLODIPINE BESYLATE 5 MG PO TABS
5.0000 mg | ORAL_TABLET | Freq: Every day | ORAL | 1 refills | Status: DC
  Filled 2022-07-16: qty 90, 90d supply, fill #0
  Filled 2022-10-23: qty 30, 30d supply, fill #1

## 2022-07-16 MED ORDER — GABAPENTIN 300 MG PO CAPS
300.0000 mg | ORAL_CAPSULE | Freq: Every day | ORAL | 3 refills | Status: DC
  Filled 2022-07-16: qty 30, 30d supply, fill #0
  Filled 2022-08-29 – 2022-09-06 (×2): qty 30, 30d supply, fill #1

## 2022-07-16 NOTE — Patient Instructions (Signed)

## 2022-07-16 NOTE — Progress Notes (Signed)
Subjective:  Patient ID: Jason Pacheco, male    DOB: Oct 15, 1966  Age: 56 y.o. MRN: [id number]  CC: Annual Exam and Medication Refill   HPI Jason Pacheco is a 56 y.o. year old male with a history of hypertension, hyperlipidemia, tobacco abuse who presents today for a follow-up visit.  Last seen in the Clinic 2 years ago.  He states he did have some stressors and some challenges that precluded him from keeping his appointments.  Interval History:  He has been out of his antihypertensives hence his elevated BP.  Has also been out of his statin.  He has numbness from his hands all the way up to his elbow and sometimes radiates to his shoulder, left>right. 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 He has no neck pain, no decreased handgrip and is right-hand dominant.  Symptoms have been present for over 2 years.  He has no paresthesia in other body parts. He drinks about 5-6 beers and 6 shots on the weekends. Past Medical History:  Diagnosis Date   Hypertension     No past surgical history on file.  Family History  Problem Relation Age of Onset   Hypertension Mother    Heart disease Mother     Social History   Socioeconomic History   Marital status: Single    Spouse name: Not on file   Number of children: Not on file   Years of education: Not on file   Highest education level: Not on file  Occupational History   Not on file  Tobacco Use   Smoking status: Every Day    Packs/day: 1.00    Types: Cigarettes   Smokeless tobacco: Never  Substance and Sexual Activity   Alcohol use: Yes    Comment: 2 40 oz   Drug use: No   Sexual activity: Not on file  Other Topics Concern   Not on file  Social History Narrative   Not on file   Social Determinants of Health   Financial Resource Strain: Not on file  Food Insecurity: Not on file  Transportation Needs: Not on file  Physical Activity: Not on file  Stress: Not on file  Social Connections: Not  on file    No Known Allergies  Outpatient Medications Prior to Visit  Medication Sig Dispense Refill   aspirin EC 81 MG tablet Take 1 tablet (81 mg total) by mouth daily. (Patient not taking: Reported on 12/03/2017) 30 tablet 2   Vitamin D, Ergocalciferol, (DRISDOL) 1.25 MG (50000 UNIT) CAPS capsule TAKE ONE TABLE BY MOUTH ONCE A WEEK. FOR 12 WEEKS. (Patient not taking: Reported on 07/16/2022) 12 capsule 0   amLODipine (NORVASC) 5 MG tablet Take 1 tablet (5 mg total) by mouth daily. (Patient not taking: Reported on 07/16/2022) 30 tablet 6   amLODipine (NORVASC) 5 MG tablet TAKE 1 TABLET (5 MG TOTAL) BY MOUTH DAILY. 30 tablet 6   lisinopril (ZESTRIL) 20 MG tablet Take 1 tablet (20 mg total) by mouth daily. (Patient not taking: Reported on 07/16/2022) 30 tablet 6   lisinopril (ZESTRIL) 20 MG tablet TAKE 1 TABLET (20 MG TOTAL) BY MOUTH DAILY. 30 tablet 6   rosuvastatin (CRESTOR) 20 MG tablet Take 1 tablet (20 mg total) by mouth daily. (Patient not taking: Reported on 07/16/2022) 30 tablet 6   rosuvastatin (CRESTOR) 20 MG tablet TAKE 1 TABLET (20 MG TOTAL) BY MOUTH DAILY. 30 tablet 6   No facility-administered medications prior to visit.  ROS Review of Systems  Constitutional:  Negative for activity change and appetite change.  HENT:  Negative for sinus pressure and sore throat.   Eyes:  Negative for visual disturbance.  Respiratory:  Negative for cough, chest tightness and shortness of breath.   Cardiovascular:  Negative for chest pain and leg swelling.  Gastrointestinal:  Negative for abdominal distention, abdominal pain, constipation and diarrhea.  Endocrine: Negative.   Genitourinary:  Negative for dysuria.  Musculoskeletal:  Negative for joint swelling and myalgias.  Skin:  Negative for rash.  Allergic/Immunologic: Negative.   Neurological:  Positive for numbness. Negative for weakness and light-headedness.  Psychiatric/Behavioral:  Negative for dysphoric mood and suicidal ideas.      Objective:  BP (!) 146/93   Pulse 63   Ht '5\' 9"'  (1.753 m)   Wt 177 lb (80.3 kg)   SpO2 100%   BMI 26.14 kg/m      07/16/2022   10:08 AM 07/16/2022    9:54 AM 07/14/2020    8:46 AM  BP/Weight  Systolic BP 110  315  Diastolic BP 93  83  Wt. (Lbs)  177 197  BMI  26.14 kg/m2 29.09 kg/m2      Physical Exam Constitutional:      Appearance: He is well-developed.  Cardiovascular:     Rate and Rhythm: Normal rate.     Heart sounds: Normal heart sounds. No murmur heard. Pulmonary:     Effort: Pulmonary effort is normal.     Breath sounds: Normal breath sounds. No wheezing or rales.  Chest:     Chest wall: No tenderness.  Abdominal:     General: Bowel sounds are normal. There is no distension.     Palpations: Abdomen is soft. There is no mass.     Tenderness: There is no abdominal tenderness.  Musculoskeletal:        General: Normal range of motion.     Right lower leg: No edema.     Left lower leg: No edema.     Comments: Negative Tinel and Phalen signs Normal handgrip bilaterally  Neurological:     Mental Status: He is alert and oriented to person, place, and time.  Psychiatric:        Mood and Affect: Mood normal.        Latest Ref Rng & Units 04/13/2020    9:43 AM 04/07/2019    4:43 PM 12/03/2017    4:53 PM  CMP  Glucose 65 - 99 mg/dL 96  92  82   BUN 6 - 24 mg/dL '16  12  14   ' Creatinine 0.76 - 1.27 mg/dL 1.36  1.12  1.09   Sodium 134 - 144 mmol/L 143  141  143   Potassium 3.5 - 5.2 mmol/L 4.2  4.4  4.1   Chloride 96 - 106 mmol/L 105  106  109   CO2 20 - 29 mmol/L '20  20  19   ' Calcium 8.7 - 10.2 mg/dL 9.8  9.3  9.4   Total Protein 6.0 - 8.5 g/dL 7.7  7.0  7.7   Total Bilirubin 0.0 - 1.2 mg/dL 1.0  0.8  0.8   Alkaline Phos 48 - 121 IU/L 69  59  55   AST 0 - 40 IU/L 22  35  25   ALT 0 - 44 IU/L 33  44  25     Lipid Panel     Component Value Date/Time   CHOL 161 04/13/2020 0943  TRIG 84 04/13/2020 0943   HDL 52 04/13/2020 0943   CHOLHDL 3.1  04/13/2020 0943   CHOLHDL 3.2 01/09/2017 1707   VLDL 19 01/09/2017 1707   LDLCALC 93 04/13/2020 0943    CBC    Component Value Date/Time   WBC 4.5 04/13/2020 0943   WBC 5.5 12/18/2015 0308   RBC 5.73 04/13/2020 0943   RBC 4.70 12/18/2015 0308   HGB 15.9 04/13/2020 0943   HCT 48.2 04/13/2020 0943   PLT 197 04/13/2020 0943   MCV 84 04/13/2020 0943   MCH 27.7 04/13/2020 0943   MCH 28.7 12/18/2015 0308   MCHC 33.0 04/13/2020 0943   MCHC 32.8 12/18/2015 0308   RDW 13.6 04/13/2020 0943   LYMPHSABS 1.4 04/13/2020 0943   MONOABS 0.5 12/17/2015 1556   EOSABS 0.1 04/13/2020 0943   BASOSABS 0.0 04/13/2020 0943    No results found for: "HGBA1C"  Assessment & Plan:  1. Mixed hyperlipidemia Likely to be uncontrolled given he has been out of his statin which I have refilled Low-cholesterol diet - rosuvastatin (CRESTOR) 20 MG tablet; Take 1 tablet (20 mg total) by mouth daily.  Dispense: 90 tablet; Refill: 1 - LP+Non-HDL Cholesterol - CMP14+EGFR  2. Screening for colon cancer - Ambulatory referral to Gastroenterology  3. Bilateral carpal tunnel syndrome We will place on gabapentin and referred to orthopedics for possible corticosteroid injection - AMB referral to orthopedics  4. Paresthesia Multiple etiologies including carpal tunnel syndrome, alcoholic peripheral neuropathy Trial of gabapentin we have discussed sedating side effects Advised alcohol cessation will be beneficial - gabapentin (NEURONTIN) 300 MG capsule; Take 1 capsule (300 mg total) by mouth at bedtime.  Dispense: 30 capsule; Refill: 3 - CBC with Differential/Platelet - Hemoglobin A1c - Vitamin B12  5. Need for hepatitis C screening test - HCV Ab w Reflex to Quant PCR  6. Need for immunization against influenza - Flu Vaccine QUAD 55moIM (Fluarix, Fluzone & Alfiuria Quad PF)  7. Essential hypertension Uncontrolled due to running out of medications which I have refilled. - amLODipine (NORVASC) 5 MG tablet;  Take 1 tablet (5 mg total) by mouth daily.  Dispense: 90 tablet; Refill: 1 - lisinopril (ZESTRIL) 20 MG tablet; Take 1 tablet (20 mg total) by mouth daily.  Dispense: 90 tablet; Refill: 1  8.  Need for shingles vaccine Shingles vaccine administered   Health Care Maintenance: Previously referred for colonoscopy but he never followed through with this.  I will refer him again Meds ordered this encounter  Medications   gabapentin (NEURONTIN) 300 MG capsule    Sig: Take 1 capsule (300 mg total) by mouth at bedtime.    Dispense:  30 capsule    Refill:  3   amLODipine (NORVASC) 5 MG tablet    Sig: Take 1 tablet (5 mg total) by mouth daily.    Dispense:  90 tablet    Refill:  1   lisinopril (ZESTRIL) 20 MG tablet    Sig: Take 1 tablet (20 mg total) by mouth daily.    Dispense:  90 tablet    Refill:  1   rosuvastatin (CRESTOR) 20 MG tablet    Sig: Take 1 tablet (20 mg total) by mouth daily.    Dispense:  90 tablet    Refill:  1    Follow-up: Return in about 3 months (around 10/15/2022) for Chronic medical conditions, 2nd dose shingrix.       ECharlott Rakes MD, FAAFP. [place] [phone]   07/16/2022, 10:44 AM

## 2022-07-17 LAB — HCV AB W REFLEX TO QUANT PCR: HCV Ab: NONREACTIVE

## 2022-07-17 LAB — HCV INTERPRETATION

## 2022-07-17 LAB — CBC WITH DIFFERENTIAL/PLATELET
Basophils Absolute: 0 10*3/uL (ref 0.0–0.2)
Basos: 1 %
EOS (ABSOLUTE): 0.1 10*3/uL (ref 0.0–0.4)
Eos: 2 %
Hematocrit: 46.6 % (ref 37.5–51.0)
Hemoglobin: 15.1 g/dL (ref 13.0–17.7)
Immature Grans (Abs): 0 10*3/uL (ref 0.0–0.1)
Immature Granulocytes: 0 %
Lymphocytes Absolute: 1.2 10*3/uL (ref 0.7–3.1)
Lymphs: 32 %
MCH: 27.4 pg (ref 26.6–33.0)
MCHC: 32.4 g/dL (ref 31.5–35.7)
MCV: 85 fL (ref 79–97)
Monocytes Absolute: 0.4 10*3/uL (ref 0.1–0.9)
Monocytes: 12 %
Neutrophils Absolute: 2 10*3/uL (ref 1.4–7.0)
Neutrophils: 53 %
Platelets: 202 10*3/uL (ref 150–450)
RBC: 5.51 x10E6/uL (ref 4.14–5.80)
RDW: 13.1 % (ref 11.6–15.4)
WBC: 3.8 10*3/uL (ref 3.4–10.8)

## 2022-07-17 LAB — LP+NON-HDL CHOLESTEROL
Cholesterol, Total: 162 mg/dL (ref 100–199)
HDL: 78 mg/dL (ref >39)
LDL Chol Calc (NIH): 73 mg/dL (ref 0–99)
Total Non-HDL-Chol (LDL+VLDL): 84 mg/dL (ref 0–129)
Triglycerides: 51 mg/dL (ref 0–149)
VLDL Cholesterol Cal: 11 mg/dL (ref 5–40)

## 2022-07-17 LAB — CMP14+EGFR
ALT: 35 IU/L (ref 0–44)
AST: 24 IU/L (ref 0–40)
Albumin/Globulin Ratio: 1.7 (ref 1.2–2.2)
Albumin: 4.6 g/dL (ref 3.8–4.9)
Alkaline Phosphatase: 70 IU/L (ref 44–121)
BUN/Creatinine Ratio: 13 (ref 9–20)
BUN: 14 mg/dL (ref 6–24)
Bilirubin Total: 0.7 mg/dL (ref 0.0–1.2)
CO2: 20 mmol/L (ref 20–29)
Calcium: 9.8 mg/dL (ref 8.7–10.2)
Chloride: 104 mmol/L (ref 96–106)
Creatinine, Ser: 1.1 mg/dL (ref 0.76–1.27)
Globulin, Total: 2.7 g/dL (ref 1.5–4.5)
Glucose: 91 mg/dL (ref 70–99)
Potassium: 5.1 mmol/L (ref 3.5–5.2)
Sodium: 143 mmol/L (ref 134–144)
Total Protein: 7.3 g/dL (ref 6.0–8.5)
eGFR: 78 mL/min/{1.73_m2} (ref >59)

## 2022-07-17 LAB — VITAMIN B12: Vitamin B-12: 485 pg/mL (ref 232–1245)

## 2022-07-17 LAB — HEMOGLOBIN A1C
Est. average glucose Bld gHb Est-mCnc: 126 mg/dL
Hgb A1c MFr Bld: 6 % — ABNORMAL HIGH (ref 4.8–5.6)

## 2022-07-25 ENCOUNTER — Encounter: Payer: Self-pay | Admitting: Orthopaedic Surgery

## 2022-07-25 ENCOUNTER — Ambulatory Visit: Payer: Commercial Managed Care - HMO | Admitting: Orthopaedic Surgery

## 2022-07-25 DIAGNOSIS — R202 Paresthesia of skin: Secondary | ICD-10-CM | POA: Diagnosis not present

## 2022-07-25 NOTE — Progress Notes (Signed)
   Office Visit Note   Patient: Jason Pacheco           Date of Birth: 19-Jul-1966           MRN: [id number] Visit Date: 07/25/2022              Requested by: Charlott Rakes, MD [address],  [place] [address] PCP: Charlott Rakes, MD   Assessment & Plan: Visit Diagnoses:  1. Paresthesia of hand, bilateral     Plan: Impression is bilateral hand paresthesias concerning for bilateral carpal tunnel syndrome left greater than right.  At this point, I discussed obtaining nerve conduction study/EMG to assess for this.  We have also discussed providing him with bilateral wrist splints but he is not interested.  He will follow-up with [place] once the nerve conduction study has been completed.  Call with concerns or questions.  Follow-Up Instructions: Return for after NCS/EMG.   Orders:  No orders of the defined types were placed in this encounter.  No orders of the defined types were placed in this encounter.     Procedures: No procedures performed   Clinical Data: No additional findings.   Subjective: Chief Complaint  Patient presents with   Right Wrist - Numbness   Left Wrist - Numbness    HPI patient is a pleasant 56 year old right-hand-dominant gentleman who comes in today with bilateral hand pain and paresthesias for years.  His symptoms have progressively worsened.  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  The pain he has is to both hands and radiates into the forearm.  He does note paresthesias to his entire hand both sides but worse in the middle finger on the left.  No history of neck pathology.  Symptoms appear to be worse when he is sleeping.  He also notes remote history of bilateral long finger trigger fingers for which she has not had injections for the past.  No current triggering.  Review of Systems as detailed in HPI.  All others are negative.   Objective: Vital Signs: There were no vitals  taken for this visit.  Physical Exam well-developed well-nourished gentleman in no acute distress.  Alert and oriented x3.  Ortho Exam bilateral hand exam shows negative Phalen and negative Tinel.  No thenar atrophy.  He is neurovascular intact distally.    Specialty Comments:  No specialty comments available.  Imaging: No new imaging   PMFS History: Patient Active Problem List   Diagnosis Date Noted   Mixed hyperlipidemia 12/30/2018   Alcohol abuse 12/30/2018   Tobacco abuse 03/03/2018   Left upper extremity numbness    Syncope 12/17/2015   Essential hypertension 12/17/2015   Bilateral arm pain 12/17/2015   Past Medical History:  Diagnosis Date   Hypertension     Family History  Problem Relation Age of Onset   Hypertension Mother    Heart disease Mother     History reviewed. No pertinent surgical history. Social History   Occupational History   Not on file  Tobacco Use   Smoking status: Every Day    Packs/day: 1.00    Types: Cigarettes   Smokeless tobacco: Never  Substance and Sexual Activity   Alcohol use: Yes    Comment: 2 40 oz   Drug use: No   Sexual activity: Not on file

## 2022-08-03 ENCOUNTER — Telehealth: Payer: Self-pay | Admitting: Physical Medicine and Rehabilitation

## 2022-08-03 ENCOUNTER — Encounter: Payer: Self-pay | Admitting: Physical Medicine and Rehabilitation

## 2022-08-03 ENCOUNTER — Ambulatory Visit (INDEPENDENT_AMBULATORY_CARE_PROVIDER_SITE_OTHER): Payer: Commercial Managed Care - HMO | Admitting: Physical Medicine and Rehabilitation

## 2022-08-03 DIAGNOSIS — R202 Paresthesia of skin: Secondary | ICD-10-CM | POA: Diagnosis not present

## 2022-08-03 NOTE — Telephone Encounter (Signed)
Patient called advised he is going to be late for his appointment. I advised patient that he had a 15 minute window after 15 minutes his appointment will need to be rescheduled.

## 2022-08-03 NOTE — Progress Notes (Signed)
Numbness and tinging in bilateral hands. Left is worse than right. Right hand dominant. Symptoms for 3 years. Getting worse. Uses hands a lot for work. Only has pain when at rest

## 2022-08-06 NOTE — Procedures (Signed)
EMG & NCV Findings: Evaluation of the left median motor and the right median motor nerves showed prolonged distal onset latency (L5.5, R4.8 ms) and decreased conduction velocity (Elbow-Wrist, L44, R46 m/s).  The left median (across palm) sensory nerve showed prolonged distal peak latency (Wrist, 5.5 ms) and prolonged distal peak latency (Palm, 7.3 ms).  The right median (across palm) sensory nerve showed prolonged distal peak latency (Wrist, 4.8 ms).  All remaining nerves (as indicated in the following tables) were within normal limits.  All left vs. right side differences were within normal limits.    All examined muscles (as indicated in the following table) showed no evidence of electrical instability.    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   There is no significant electrodiagnostic evidence of any other focal nerve entrapment, brachial plexopathy or cervical radiculopathy.   Recommendations: 1.  Follow-up with referring physician. 2.  Continue current management of symptoms. 3.  Suggest surgical evaluation.  ___________________________ Laurence Spates FAAPMR Board Certified, American Board of Physical Medicine and Rehabilitation    Nerve Conduction Studies Anti Sensory Summary Table   Stim Site NR Peak (ms) Norm Peak (ms) P-T Amp (V) Norm P-T Amp Site1 Site2 Delta-P (ms) Dist (cm) Vel (m/s) Norm Vel (m/s)  Left Median Acr Palm Anti Sensory (2nd Digit)  31.7C  Wrist    *5.5 <3.6 14.1 >10 Wrist Palm 1.8 0.0    Palm    *7.3 <2.0 9.5         Right Median Acr Palm Anti Sensory (2nd Digit)  30.8C  Wrist    *4.8 <3.6 21.0 >10 Wrist Palm 3.0 0.0    Palm    1.8 <2.0 6.6         Left Radial Anti Sensory (Base 1st Digit)  31.1C  Wrist    1.9 <3.1 25.0  Wrist Base 1st Digit 1.9 0.0    Right Radial Anti Sensory (Base 1st  Digit)  31.1C  Wrist    2.3 <3.1 25.3  Wrist Base 1st Digit 2.3 0.0    Left Ulnar Anti Sensory (5th Digit)  31.7C  Wrist    3.3 <3.7 23.4 >15.0 Wrist 5th Digit 3.3 14.0 42 >38  Right Ulnar Anti Sensory (5th Digit)  31.3C  Wrist    3.3 <3.7 23.0 >15.0 Wrist 5th Digit 3.3 14.0 42 >38   Motor Summary Table   Stim Site NR Onset (ms) Norm Onset (ms) O-P Amp (mV) Norm O-P Amp Site1 Site2 Delta-0 (ms) Dist (cm) Vel (m/s) Norm Vel (m/s)  Left Median Motor (Abd Poll Brev)  31.5C  Wrist    *5.5 <4.2 6.4 >5 Elbow Wrist 5.3 23.5 *44 >50  Elbow    10.8  7.0         Right Median Motor (Abd Poll Brev)  31.1C  Wrist    *4.8 <4.2 7.7 >5 Elbow Wrist 4.8 22.0 *46 >50  Elbow    9.6  7.4         Left Ulnar Motor (Abd Dig Min)  31.5C  Wrist    3.0 <4.2 7.9 >3 B Elbow Wrist 3.9 22.0 56 >53  B Elbow    6.9  6.0  A Elbow B Elbow 1.2 10.0 83 >53  A Elbow    8.1  6.1         Right Ulnar Motor (Abd Dig Min)  31.2C  Wrist    3.0 <4.2 10.3 >3 B Elbow Wrist 4.0 23.0 58 >53  B Elbow    7.0  10.1  A Elbow B Elbow 1.3 10.0 77 >53  A Elbow    8.3  10.2          EMG   Side Muscle Nerve Root Ins Act Fibs Psw Amp Dur Poly Recrt Int Fraser Din Comment  Left Abd Poll Brev Median C8-T1 Nml Nml Nml Nml Nml 0 Nml Nml   Left 1stDorInt Ulnar C8-T1 Nml Nml Nml Nml Nml 0 Nml Nml   Left PronatorTeres Median C6-7 Nml Nml Nml Nml Nml 0 Nml Nml   Left Biceps Musculocut C5-6 Nml Nml Nml Nml Nml 0 Nml Nml   Left Deltoid Axillary C5-6 Nml Nml Nml Nml Nml 0 Nml Nml     Nerve Conduction Studies Anti Sensory Left/Right Comparison   Stim Site L Lat (ms) R Lat (ms) L-R Lat (ms) L Amp (V) R Amp (V) L-R Amp (%) Site1 Site2 L Vel (m/s) R Vel (m/s) L-R Vel (m/s)  Median Acr Palm Anti Sensory (2nd Digit)  31.7C  Wrist *5.5 *4.8 0.7 14.1 21.0 32.9 Wrist Palm     Palm *7.3 1.8 5.5 9.5 6.6 30.5       Radial Anti Sensory (Base 1st Digit)  31.1C  Wrist 1.9 2.3 0.4 25.0 25.3 1.2 Wrist Base 1st Digit     Ulnar Anti Sensory (5th Digit)   31.7C  Wrist 3.3 3.3 0.0 23.4 23.0 1.7 Wrist 5th Digit 42 42 0   Motor Left/Right Comparison   Stim Site L Lat (ms) R Lat (ms) L-R Lat (ms) L Amp (mV) R Amp (mV) L-R Amp (%) Site1 Site2 L Vel (m/s) R Vel (m/s) L-R Vel (m/s)  Median Motor (Abd Poll Brev)  31.5C  Wrist *5.5 *4.8 0.7 6.4 7.7 16.9 Elbow Wrist *44 *46 2  Elbow 10.8 9.6 1.2 7.0 7.4 5.4       Ulnar Motor (Abd Dig Min)  31.5C  Wrist 3.0 3.0 0.0 7.9 10.3 23.3 B Elbow Wrist 56 58 2  B Elbow 6.9 7.0 0.1 6.0 10.1 40.6 A Elbow B Elbow 83 77 6  A Elbow 8.1 8.3 0.2 6.1 10.2 40.2          Waveforms:

## 2022-08-06 NOTE — Progress Notes (Signed)
Jason Pacheco - 56 y.o. male MRN [id number]  Date of birth: [birth year]  Office Visit Note: Visit Date: 08/03/2022 PCP: Charlott Rakes, MD Referred by: Leandrew Koyanagi, MD  Subjective: Chief Complaint  Patient presents with   Left Hand - Numbness   Right Hand - Numbness   HPI:  Jason Pacheco is a 56 y.o. male who comes in today at the request of Dr. Eduard Roux for electrodiagnostic study of the Bilateral upper extremities.  Patient is Right hand dominant.  He reports 3 years of off-and-on bilateral hand pain but now with several months of more consistent more constant worsening symptoms quite severe this really limiting what he can do at work.  He uses his hands a lot at work.  His left is actually feeling a little worse than the right.  He seems to be able to work through it when he is hurting but then he will have pain afterwards.  He does get nocturnal complaints.  He has a positive flick sign.  He has no history of prior electrodiagnostic studies.  No frank radicular symptoms.   ROS Otherwise per HPI.  Assessment & Plan: Visit Diagnoses:    ICD-10-CM   1. 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   There is no significant electrodiagnostic evidence of any other focal nerve entrapment, brachial plexopathy or cervical radiculopathy.   Recommendations: 1.  Follow-up with referring physician. 2.  Continue current management of symptoms. 3.  Suggest surgical evaluation.  Meds & Orders: No orders of the defined types were placed in this encounter.   Orders Placed This Encounter  Procedures   NCV with EMG (electromyography)    Follow-up: Return in about 2 weeks (around 08/17/2022) for  Eduard Roux, MD.   Procedures: No procedures performed  EMG & NCV  Findings: Evaluation of the left median motor and the right median motor nerves showed prolonged distal onset latency (L5.5, R4.8 ms) and decreased conduction velocity (Elbow-Wrist, L44, R46 m/s).  The left median (across palm) sensory nerve showed prolonged distal peak latency (Wrist, 5.5 ms) and prolonged distal peak latency (Palm, 7.3 ms).  The right median (across palm) sensory nerve showed prolonged distal peak latency (Wrist, 4.8 ms).  All remaining nerves (as indicated in the following tables) were within normal limits.  All left vs. right side differences were within normal limits.    All examined muscles (as indicated in the following table) showed no evidence of electrical instability.    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   There is no significant electrodiagnostic evidence of any other focal nerve entrapment, brachial plexopathy or cervical radiculopathy.   Recommendations: 1.  Follow-up with referring physician. 2.  Continue current management of symptoms. 3.  Suggest surgical evaluation.  ___________________________ Laurence Spates FAAPMR Board Certified, American Board of Physical Medicine and Rehabilitation    Nerve Conduction Studies Anti Sensory Summary Table   Stim Site NR Peak (ms) Norm Peak (ms) P-T Amp (V) Norm P-T Amp Site1 Site2 Delta-P (ms) Dist (cm) Vel (m/s) Norm Vel (m/s)  Left Median Acr Palm Anti Sensory (2nd Digit)  31.7C  Wrist    *5.5 <3.6 14.1 >  10 Wrist Palm 1.8 0.0    Palm    *7.3 <2.0 9.5         Right Median Acr Palm Anti Sensory (2nd Digit)  30.8C  Wrist    *4.8 <3.6 21.0 >10 Wrist Palm 3.0 0.0    Palm    1.8 <2.0 6.6         Left Radial Anti Sensory (Base 1st Digit)  31.1C  Wrist    1.9 <3.1 25.0  Wrist Base 1st Digit 1.9 0.0    Right Radial Anti Sensory (Base 1st Digit)  31.1C   Wrist    2.3 <3.1 25.3  Wrist Base 1st Digit 2.3 0.0    Left Ulnar Anti Sensory (5th Digit)  31.7C  Wrist    3.3 <3.7 23.4 >15.0 Wrist 5th Digit 3.3 14.0 42 >38  Right Ulnar Anti Sensory (5th Digit)  31.3C  Wrist    3.3 <3.7 23.0 >15.0 Wrist 5th Digit 3.3 14.0 42 >38   Motor Summary Table   Stim Site NR Onset (ms) Norm Onset (ms) O-P Amp (mV) Norm O-P Amp Site1 Site2 Delta-0 (ms) Dist (cm) Vel (m/s) Norm Vel (m/s)  Left Median Motor (Abd Poll Brev)  31.5C  Wrist    *5.5 <4.2 6.4 >5 Elbow Wrist 5.3 23.5 *44 >50  Elbow    10.8  7.0         Right Median Motor (Abd Poll Brev)  31.1C  Wrist    *4.8 <4.2 7.7 >5 Elbow Wrist 4.8 22.0 *46 >50  Elbow    9.6  7.4         Left Ulnar Motor (Abd Dig Min)  31.5C  Wrist    3.0 <4.2 7.9 >3 B Elbow Wrist 3.9 22.0 56 >53  B Elbow    6.9  6.0  A Elbow B Elbow 1.2 10.0 83 >53  A Elbow    8.1  6.1         Right Ulnar Motor (Abd Dig Min)  31.2C  Wrist    3.0 <4.2 10.3 >3 B Elbow Wrist 4.0 23.0 58 >53  B Elbow    7.0  10.1  A Elbow B Elbow 1.3 10.0 77 >53  A Elbow    8.3  10.2          EMG   Side Muscle Nerve Root Ins Act Fibs Psw Amp Dur Poly Recrt Int Fraser Din Comment  Left Abd Poll Brev Median C8-T1 Nml Nml Nml Nml Nml 0 Nml Nml   Left 1stDorInt Ulnar C8-T1 Nml Nml Nml Nml Nml 0 Nml Nml   Left PronatorTeres Median C6-7 Nml Nml Nml Nml Nml 0 Nml Nml   Left Biceps Musculocut C5-6 Nml Nml Nml Nml Nml 0 Nml Nml   Left Deltoid Axillary C5-6 Nml Nml Nml Nml Nml 0 Nml Nml     Nerve Conduction Studies Anti Sensory Left/Right Comparison   Stim Site L Lat (ms) R Lat (ms) L-R Lat (ms) L Amp (V) R Amp (V) L-R Amp (%) Site1 Site2 L Vel (m/s) R Vel (m/s) L-R Vel (m/s)  Median Acr Palm Anti Sensory (2nd Digit)  31.7C  Wrist *5.5 *4.8 0.7 14.1 21.0 32.9 Wrist Palm     Palm *7.3 1.8 5.5 9.5 6.6 30.5       Radial Anti Sensory (Base 1st Digit)  31.1C  Wrist 1.9 2.3 0.4 25.0 25.3 1.2 Wrist Base 1st Digit     Ulnar Anti Sensory (5th Digit)  31.7C  Wrist  3.3 3.3 0.0 23.4 23.0 1.7 Wrist 5th Digit 42 42 0   Motor Left/Right Comparison   Stim Site L Lat (ms) R Lat (ms) L-R Lat (ms) L Amp (mV) R Amp (mV) L-R Amp (%) Site1 Site2 L Vel (m/s) R Vel (m/s) L-R Vel (m/s)  Median Motor (Abd Poll Brev)  31.5C  Wrist *5.5 *4.8 0.7 6.4 7.7 16.9 Elbow Wrist *44 *46 2  Elbow 10.8 9.6 1.2 7.0 7.4 5.4       Ulnar Motor (Abd Dig Min)  31.5C  Wrist 3.0 3.0 0.0 7.9 10.3 23.3 B Elbow Wrist 56 58 2  B Elbow 6.9 7.0 0.1 6.0 10.1 40.6 A Elbow B Elbow 83 77 6  A Elbow 8.1 8.3 0.2 6.1 10.2 40.2          Waveforms:                      Clinical History: No specialty comments available.     Objective:  VS:  HT:    WT:   BMI:     BP:   HR: bpm  TEMP: ( )  RESP:  Physical Exam Musculoskeletal:        General: No tenderness.     Comments: Inspection reveals no atrophy of the bilateral APB or FDI or hand intrinsics. There is no swelling, color changes, allodynia or dystrophic changes. There is 5 out of 5 strength in the bilateral wrist extension, finger abduction and long finger flexion. There is intact sensation to light touch in all dermatomal and peripheral nerve distributions. There is a positive Phalen's test bilaterally. There is a negative Hoffmann's test bilaterally.  Skin:    General: Skin is warm and dry.     Findings: No erythema or rash.  Neurological:     General: No focal deficit present.     Mental Status: He is alert and oriented to person, place, and time.     Sensory: No sensory deficit.     Motor: No weakness or abnormal muscle tone.     Coordination: Coordination normal.     Gait: Gait normal.  Psychiatric:        Mood and Affect: Mood normal.        Behavior: Behavior normal.        Thought Content: Thought content normal.      Imaging: No results found.

## 2022-08-29 ENCOUNTER — Other Ambulatory Visit: Payer: Self-pay

## 2022-08-31 ENCOUNTER — Ambulatory Visit (AMBULATORY_SURGERY_CENTER): Payer: Self-pay | Admitting: *Deleted

## 2022-08-31 ENCOUNTER — Other Ambulatory Visit: Payer: Self-pay

## 2022-08-31 VITALS — Ht 69.0 in | Wt 172.0 lb

## 2022-08-31 DIAGNOSIS — Z1211 Encounter for screening for malignant neoplasm of colon: Secondary | ICD-10-CM

## 2022-08-31 NOTE — Progress Notes (Signed)
  Pre visit completed in person Plenvu sample provided     No egg or soy allergy known to patient  No issues known to pt with past sedation with any surgeries or procedures Patient denies ever being told they had issues or difficulty with intubation  No FH of Malignant Hyperthermia Pt is not on diet pills Pt is not on  home 02  Pt is not on blood thinners  Pt denies issues with constipation  No A fib or A flutter   Pt instructed to use [email or web address] or GoodRx for a price reduction on prep

## 2022-09-05 ENCOUNTER — Other Ambulatory Visit: Payer: Self-pay

## 2022-09-06 ENCOUNTER — Other Ambulatory Visit: Payer: Self-pay

## 2022-09-13 ENCOUNTER — Encounter: Payer: Self-pay | Admitting: Gastroenterology

## 2022-09-16 ENCOUNTER — Encounter: Payer: Self-pay | Admitting: Certified Registered Nurse Anesthetist

## 2022-09-24 ENCOUNTER — Ambulatory Visit (AMBULATORY_SURGERY_CENTER): Payer: Commercial Managed Care - HMO | Admitting: Gastroenterology

## 2022-09-24 ENCOUNTER — Encounter: Payer: Self-pay | Admitting: Gastroenterology

## 2022-09-24 VITALS — BP 145/85 | HR 79 | Temp 97.5°F | Resp 12 | Ht 69.0 in | Wt 172.0 lb

## 2022-09-24 DIAGNOSIS — K5730 Diverticulosis of large intestine without perforation or abscess without bleeding: Secondary | ICD-10-CM

## 2022-09-24 DIAGNOSIS — Z1211 Encounter for screening for malignant neoplasm of colon: Secondary | ICD-10-CM | POA: Diagnosis present

## 2022-09-24 DIAGNOSIS — K640 First degree hemorrhoids: Secondary | ICD-10-CM

## 2022-09-24 DIAGNOSIS — D124 Benign neoplasm of descending colon: Secondary | ICD-10-CM | POA: Diagnosis not present

## 2022-09-24 MED ORDER — SODIUM CHLORIDE 0.9 % IV SOLN: 500.0000 mL | Freq: Once | INTRAVENOUS | Status: AC

## 2022-09-24 NOTE — Progress Notes (Signed)
GASTROENTEROLOGY PROCEDURE H&P NOTE   Primary Care Physician: Charlott Rakes, MD    Reason for Procedure:  Colon Cancer screening  Plan:    Colonoscopy  Patient is appropriate for endoscopic procedure(s) in the ambulatory ([facility]) setting.  The nature of the procedure, as well as the risks, benefits, and alternatives were carefully and thoroughly reviewed with the patient. Ample time for discussion and questions allowed. The patient understood, was satisfied, and agreed to proceed.     HPI: Jason Pacheco is a 56 y.o. male who presents for colonoscopy for routine Colon Cancer screening.  No active GI symptoms.  No known family history of colon cancer or related malignancy.  Patient is otherwise without complaints or active issues today.  Past Medical History:  Diagnosis Date   Hyperlipidemia    Hypertension    Neuromuscular disorder ([place])    nerve pain wrists and arms    Past Surgical History:  Procedure Laterality Date   COLONOSCOPY      Prior to Admission medications   Medication Sig Start Date End Date Taking? Authorizing Provider  amLODipine (NORVASC) 5 MG tablet Take 1 tablet (5 mg total) by mouth daily. 07/16/22  Yes Charlott Rakes, MD  gabapentin (NEURONTIN) 300 MG capsule Take 1 capsule (300 mg total) by mouth at bedtime. 07/16/22  Yes Charlott Rakes, MD  lisinopril (ZESTRIL) 20 MG tablet Take 1 tablet (20 mg total) by mouth daily. 07/16/22 07/16/23 Yes Charlott Rakes, MD  rosuvastatin (CRESTOR) 20 MG tablet Take 1 tablet (20 mg total) by mouth daily. 07/16/22  Yes Charlott Rakes, MD  Vitamin D, Ergocalciferol, (DRISDOL) 1.25 MG (50000 UNIT) CAPS capsule TAKE ONE TABLE BY MOUTH ONCE A WEEK. FOR 12 WEEKS. 04/15/20  Yes Argentina Donovan, PA-C  aspirin EC 81 MG tablet Take 1 tablet (81 mg total) by mouth daily. Patient not taking: Reported on 08/31/2022 03/11/17   Alfonse Spruce, FNP  PEG-KCl-NaCl-NaSulf-Na Asc-C (PLENVU) 140 g SOLR Take 1 kit by mouth once.  Colonoscopy prep sample provided    [provider]    Current Outpatient Medications  Medication Sig Dispense Refill   amLODipine (NORVASC) 5 MG tablet Take 1 tablet (5 mg total) by mouth daily. 90 tablet 1   gabapentin (NEURONTIN) 300 MG capsule Take 1 capsule (300 mg total) by mouth at bedtime. 30 capsule 3   lisinopril (ZESTRIL) 20 MG tablet Take 1 tablet (20 mg total) by mouth daily. 90 tablet 1   rosuvastatin (CRESTOR) 20 MG tablet Take 1 tablet (20 mg total) by mouth daily. 90 tablet 1   Vitamin D, Ergocalciferol, (DRISDOL) 1.25 MG (50000 UNIT) CAPS capsule TAKE ONE TABLE BY MOUTH ONCE A WEEK. FOR 12 WEEKS. 12 capsule 0   aspirin EC 81 MG tablet Take 1 tablet (81 mg total) by mouth daily. (Patient not taking: Reported on 08/31/2022) 30 tablet 2   PEG-KCl-NaCl-NaSulf-Na Asc-C (PLENVU) 140 g SOLR Take 1 kit by mouth once. Colonoscopy prep sample provided     Current Facility-Administered Medications  Medication Dose Route Frequency Provider Last Rate Last Admin   0.9 %  sodium chloride infusion  500 mL Intravenous Once Linkin Vizzini V, DO        Allergies as of 09/24/2022   (No Known Allergies)    Family History  Problem Relation Age of Onset   Hypertension Mother    Heart disease Mother    Colon cancer Neg Hx    Esophageal cancer Neg Hx    Rectal cancer  Neg Hx    Stomach cancer Neg Hx     Social History   Socioeconomic History   Marital status: Single    Spouse name: Not on file   Number of children: Not on file   Years of education: Not on file   Highest education level: Not on file  Occupational History   Not on file  Tobacco Use   Smoking status: Every Day    Packs/day: 1.00    Types: Cigarettes   Smokeless tobacco: Never  Vaping Use   Vaping Use: Never used  Substance and Sexual Activity   Alcohol use: Yes    Comment: 2 40 oz   Drug use: No   Sexual activity: Not on file  Other Topics Concern   Not on file  Social History Narrative   Not  on file   Social Determinants of Health   Financial Resource Strain: Not on file  Food Insecurity: Not on file  Transportation Needs: Not on file  Physical Activity: Not on file  Stress: Not on file  Social Connections: Not on file  Intimate Partner Violence: Not on file    Physical Exam: Vital signs in last 24 hours: _0  (!) 149/101   Pulse 92   Temp (!) 97.5 F (36.4 C) (Temporal)   Ht _1  (1.753 m)   Wt 172 lb (78 kg)   SpO2 100%   BMI 25.40 kg/m  GEN: NAD EYE: Sclerae anicteric ENT: MMM CV: Non-tachycardic Pulm: CTA b/l GI: Soft, NT/ND NEURO:  Alert & Oriented x 3   Gerrit Heck, DO [facility]   09/24/2022 11:53 AM

## 2022-09-24 NOTE — Op Note (Signed)
[place] Patient Name: Jason Pacheco Procedure Date: 09/24/2022 11:49 AM MRN: [id number] Endoscopist: Gerrit Heck , MD, [id number] Age: 56 Referring MD:  Date of Birth: 15-Dec-1965 Gender: Male Account #: [id number] Procedure:                Colonoscopy Indications:              Screening for colorectal malignant neoplasm, This                            is the patient's first colonoscopy Medicines:                Monitored Anesthesia Care Procedure:                Pre-Anesthesia Assessment:                           - Prior to the procedure, a History and Physical                            was performed, and patient medications and                            allergies were reviewed. The patient's tolerance of                            previous anesthesia was also reviewed. The risks                            and benefits of the procedure and the sedation                            options and risks were discussed with the patient.                            All questions were answered, and informed consent                            was obtained. Prior Anticoagulants: The patient has                            taken no anticoagulant or antiplatelet agents. ASA                            Grade Assessment: II - A patient with mild systemic                            disease. After reviewing the risks and benefits,                            the patient was deemed in satisfactory condition to                            undergo the procedure.  After obtaining informed consent, the colonoscope                            was passed under direct vision. Throughout the                            procedure, the patient's blood pressure, pulse, and                            oxygen saturations were monitored continuously. The                            Olympus CF-HQ190L ([id number]) Colonoscope was                            introduced through the anus  and advanced to the the                            cecum, identified by appendiceal orifice and                            ileocecal valve. The colonoscopy was performed                            without difficulty. The patient tolerated the                            procedure well. The quality of the bowel                            preparation was good. The ileocecal valve,                            appendiceal orifice, and rectum were photographed. Scope In: 12:03:19 PM Scope Out: 12:13:32 PM Scope Withdrawal Time: 0 hours 8 minutes 17 seconds  Total Procedure Duration: 0 hours 10 minutes 13 seconds  Findings:                 The perianal and digital rectal examinations were                            normal.                           A 3 mm polyp was found in the descending colon. The                            polyp was sessile. The polyp was removed with a                            cold snare. Resection and retrieval were complete.                            Estimated blood loss was minimal.  Multiple large-mouthed and small-mouthed                            diverticula were found in the sigmoid colon,                            transverse colon and ascending colon.                           Non-bleeding internal hemorrhoids were found during                            retroflexion. The hemorrhoids were small. Complications:            No immediate complications. Estimated Blood Loss:     Estimated blood loss was minimal. Impression:               - One 3 mm polyp in the descending colon, removed                            with a cold snare. Resected and retrieved.                           - Diverticulosis in the sigmoid colon, in the                            transverse colon and in the ascending colon.                           - Non-bleeding internal hemorrhoids. Recommendation:           - Patient has a contact number available for                             emergencies. The signs and symptoms of potential                            delayed complications were discussed with the                            patient. Return to normal activities tomorrow.                            Written discharge instructions were provided to the                            patient.                           - Resume previous diet.                           - Continue present medications.                           - Await pathology results.                           -  Repeat colonoscopy in 7-10 years for surveillance                            based on pathology results.                           - Return to GI clinic PRN.                           - Internal hemorrhoids were noted on this study and                            may be amenable to hemorrhoid band ligation. If you                            are interested in further treatment of these                            hemorrhoids with band ligation, please contact my                            clinic to set up an appointment for evaluation and                            treatment. Gerrit Heck, MD 09/24/2022 12:17:11 PM

## 2022-09-24 NOTE — Patient Instructions (Signed)
Information on polyps and diverticulosis given to you today.  Await pathology results.  Resume previous diet and medications.  Internal hemorrhoids may be amenable to hemorrhoid band ligations and can be set up with the office of Dr. Bryan Lemma.   YOU HAD AN ENDOSCOPIC PROCEDURE TODAY AT [facility]:   Refer to the procedure report that was given to you for any specific questions about what was found during the examination.  If the procedure report does not answer your questions, please call your gastroenterologist to clarify.  If you requested that your care partner not be given the details of your procedure findings, then the procedure report has been included in a sealed envelope for you to review at your convenience later.  YOU SHOULD EXPECT: Some feelings of bloating in the abdomen. Passage of more gas than usual.  Walking can help get rid of the air that was put into your GI tract during the procedure and reduce the bloating. If you had a lower endoscopy (such as a colonoscopy or flexible sigmoidoscopy) you may notice spotting of blood in your stool or on the toilet paper. If you underwent a bowel prep for your procedure, you may not have a normal bowel movement for a few days.  Please Note:  You might notice some irritation and congestion in your nose or some drainage.  This is from the oxygen used during your procedure.  There is no need for concern and it should clear up in a day or so.  SYMPTOMS TO REPORT IMMEDIATELY:  Following lower endoscopy (colonoscopy or flexible sigmoidoscopy):  Excessive amounts of blood in the stool  Significant tenderness or worsening of abdominal pains  Swelling of the abdomen that is new, acute  Fever of 100F or higher  For urgent or emergent issues, a gastroenterologist can be reached at any hour by calling [phone]. Do not use MyChart messaging for urgent concerns.    DIET:  We do recommend a small meal at first, but then you  may proceed to your regular diet.  Drink plenty of fluids but you should avoid alcoholic beverages for 24 hours.  ACTIVITY:  You should plan to take it easy for the rest of today and you should NOT DRIVE or use heavy machinery until tomorrow (because of the sedation medicines used during the test).    FOLLOW UP: Our staff will call the number listed on your records the next business day following your procedure.  We will call around 7:15- 8:00 am to check on you and address any questions or concerns that you may have regarding the information given to you following your procedure. If we do not reach you, we will leave a message.     If any biopsies were taken you will be contacted by phone or by letter within the next 1-3 weeks.  Please call us at [phone] if you have not heard about the biopsies in 3 weeks.    SIGNATURES/CONFIDENTIALITY: You and/or your care partner have signed paperwork which will be entered into your electronic medical record.  These signatures attest to the fact that that the information above on your After Visit Summary has been reviewed and is understood.  Full responsibility of the confidentiality of this discharge information lies with you and/or your care-partner.

## 2022-09-24 NOTE — Progress Notes (Signed)
BP 146/108, Labetalol given IV, MD update, vss

## 2022-09-24 NOTE — Progress Notes (Signed)
Pt's states no medical or surgical changes since previsit or office visit. 

## 2022-09-24 NOTE — Progress Notes (Signed)
Called to room to assist during endoscopic procedure.  Patient ID and intended procedure confirmed with present staff. Received instructions for my participation in the procedure from the performing physician.  

## 2022-09-24 NOTE — Progress Notes (Signed)
Report given to PACU, vss 

## 2022-09-25 ENCOUNTER — Telehealth: Payer: Self-pay | Admitting: *Deleted

## 2022-09-25 NOTE — Telephone Encounter (Signed)
  Follow up Call-     09/24/2022   10:56 AM  Call back number  Post procedure Call Back phone  # [phone]  Permission to leave phone message Yes     Patient questions:  Do you have a fever, pain , or abdominal swelling? No. Pain Score  0 *  Have you tolerated food without any problems? Yes.    Have you been able to return to your normal activities? Yes.    Do you have any questions about your discharge instructions: Diet   No. Medications  No. Follow up visit  No.  Do you have questions or concerns about your Care? No.  Actions: * If pain score is 4 or above: No action needed, pain <4.

## 2022-09-27 ENCOUNTER — Encounter: Payer: Self-pay | Admitting: Gastroenterology

## 2022-10-23 ENCOUNTER — Other Ambulatory Visit: Payer: Self-pay

## 2022-10-23 ENCOUNTER — Ambulatory Visit: Payer: Commercial Managed Care - HMO | Admitting: Family Medicine

## 2022-10-25 ENCOUNTER — Encounter: Payer: Self-pay | Admitting: Family Medicine

## 2022-10-25 ENCOUNTER — Ambulatory Visit: Payer: Commercial Managed Care - HMO | Attending: Family Medicine | Admitting: Family Medicine

## 2022-10-25 ENCOUNTER — Other Ambulatory Visit: Payer: Self-pay

## 2022-10-25 VITALS — BP 128/79 | HR 76 | Temp 98.4°F | Ht 69.0 in | Wt 184.0 lb

## 2022-10-25 DIAGNOSIS — R202 Paresthesia of skin: Secondary | ICD-10-CM | POA: Diagnosis not present

## 2022-10-25 DIAGNOSIS — I10 Essential (primary) hypertension: Secondary | ICD-10-CM | POA: Diagnosis not present

## 2022-10-25 DIAGNOSIS — E782 Mixed hyperlipidemia: Secondary | ICD-10-CM | POA: Diagnosis not present

## 2022-10-25 DIAGNOSIS — Z23 Encounter for immunization: Secondary | ICD-10-CM | POA: Diagnosis not present

## 2022-10-25 DIAGNOSIS — F17210 Nicotine dependence, cigarettes, uncomplicated: Secondary | ICD-10-CM | POA: Diagnosis not present

## 2022-10-25 DIAGNOSIS — R7303 Prediabetes: Secondary | ICD-10-CM

## 2022-10-25 MED ORDER — ROSUVASTATIN CALCIUM 20 MG PO TABS
20.0000 mg | ORAL_TABLET | Freq: Every day | ORAL | 1 refills | Status: DC
  Filled 2022-10-25: qty 30, 30d supply, fill #0

## 2022-10-25 MED ORDER — LISINOPRIL 20 MG PO TABS
20.0000 mg | ORAL_TABLET | Freq: Every day | ORAL | 1 refills | Status: DC
  Filled 2022-10-25 – 2022-11-27 (×2): qty 90, 90d supply, fill #0
  Filled 2023-03-14: qty 90, 90d supply, fill #1

## 2022-10-25 MED ORDER — AMLODIPINE BESYLATE 5 MG PO TABS
5.0000 mg | ORAL_TABLET | Freq: Every day | ORAL | 1 refills | Status: DC
  Filled 2022-10-25 – 2022-11-27 (×2): qty 90, 90d supply, fill #0
  Filled 2023-03-14: qty 90, 90d supply, fill #1

## 2022-10-25 MED ORDER — GABAPENTIN 300 MG PO CAPS
300.0000 mg | ORAL_CAPSULE | Freq: Every day | ORAL | 1 refills | Status: DC
  Filled 2022-10-25: qty 30, 30d supply, fill #0

## 2022-10-25 MED ORDER — NICOTINE 7 MG/24HR TD PT24
7.0000 mg | MEDICATED_PATCH | Freq: Every day | TRANSDERMAL | 3 refills | Status: AC
  Filled 2022-10-25 – 2022-11-28 (×2): qty 28, 28d supply, fill #0

## 2022-10-25 NOTE — Patient Instructions (Signed)

## 2022-10-25 NOTE — Progress Notes (Signed)
Subjective:  Patient ID: Jason Pacheco, male    DOB: [birth year]  Age: 56 y.o. MRN: [id number]  CC: Hypertension   HPI [name] is a 56 y.o. year old male with a history of hypertension, hyperlipidemia, nicotine dependence, prediabetes (A1c 6.0 who presents today for a follow-up visit.   Interval History:  Endorses doing well on his antihypertensive and he has no chest pains or dyspnea.  He is adherent with his statin as well.  He gets a lot of exercise at his job which is very physical as he lifts a lot and is constantly moving. He smokes 3 Cigarettes/ day and is working on quitting.  In the past he did well on nicotine patches. Denies presence of additional concerns today. Past Medical History:  Diagnosis Date   Hyperlipidemia    Hypertension    Neuromuscular disorder ([place])    nerve pain wrists and arms    Past Surgical History:  Procedure Laterality Date   COLONOSCOPY      Family History  Problem Relation Age of Onset   Hypertension Mother    Heart disease Mother    Colon cancer Neg Hx    Esophageal cancer Neg Hx    Rectal cancer Neg Hx    Stomach cancer Neg Hx     Social History   Socioeconomic History   Marital status: Single    Spouse name: Not on file   Number of children: Not on file   Years of education: Not on file   Highest education level: Not on file  Occupational History   Not on file  Tobacco Use   Smoking status: Every Day    Packs/day: 1.00    Types: Cigarettes   Smokeless tobacco: Never  Vaping Use   Vaping Use: Never used  Substance and Sexual Activity   Alcohol use: Yes    Comment: 2 40 oz   Drug use: No   Sexual activity: Not on file  Other Topics Concern   Not on file  Social History Narrative   Not on file   Social Determinants of Health   Financial Resource Strain: Not on file  Food Insecurity: Not on file  Transportation Needs: Not on file  Physical Activity: Not on file  Stress: Not on file  Social Connections: Not  on file    No Known Allergies  Outpatient Medications Prior to Visit  Medication Sig Dispense Refill   amLODipine (NORVASC) 5 MG tablet Take 1 tablet (5 mg total) by mouth daily. 90 tablet 1   gabapentin (NEURONTIN) 300 MG capsule Take 1 capsule (300 mg total) by mouth at bedtime. 30 capsule 3   lisinopril (ZESTRIL) 20 MG tablet Take 1 tablet (20 mg total) by mouth daily. 90 tablet 1   rosuvastatin (CRESTOR) 20 MG tablet Take 1 tablet (20 mg total) by mouth daily. 90 tablet 1   aspirin EC 81 MG tablet Take 1 tablet (81 mg total) by mouth daily. (Patient not taking: Reported on 08/31/2022) 30 tablet 2   PEG-KCl-NaCl-NaSulf-Na Asc-C (PLENVU) 140 g SOLR Take 1 kit by mouth once. Colonoscopy prep sample provided (Patient not taking: Reported on 10/25/2022)     Vitamin D, Ergocalciferol, (DRISDOL) 1.25 MG (50000 UNIT) CAPS capsule TAKE ONE TABLE BY MOUTH ONCE A WEEK. FOR 12 WEEKS. (Patient not taking: Reported on 10/25/2022) 12 capsule 0   Facility-Administered Medications Prior to Visit  Medication Dose Route Frequency Provider Last Rate Last Admin   0.9 %  sodium chloride  infusion  500 mL Intravenous Once Cirigliano, Vito V, DO         ROS Review of Systems  Constitutional:  Negative for activity change and appetite change.  HENT:  Negative for sinus pressure and sore throat.   Respiratory:  Negative for chest tightness, shortness of breath and wheezing.   Cardiovascular:  Negative for chest pain and palpitations.  Gastrointestinal:  Negative for abdominal distention, abdominal pain and constipation.  Genitourinary: Negative.   Musculoskeletal: Negative.   Psychiatric/Behavioral:  Negative for behavioral problems and dysphoric mood.     Objective:  BP 128/79   Pulse 76   Temp 98.4 F (36.9 C) (Oral)   Ht _0  (1.753 m)   Wt 184 lb (83.5 kg)   SpO2 99%   BMI 27.17 kg/m      10/25/2022    1:41 PM 09/24/2022   12:38 PM 09/24/2022   12:28 PM  BP/Weight  Systolic BP 536 144  315  Diastolic BP 79 85 86  Wt. (Lbs) 184    BMI 27.17 kg/m2        Physical Exam Constitutional:      Appearance: He is well-developed.  Cardiovascular:     Rate and Rhythm: Normal rate.     Heart sounds: Normal heart sounds. No murmur heard. Pulmonary:     Effort: Pulmonary effort is normal.     Breath sounds: Normal breath sounds. No wheezing or rales.  Chest:     Chest wall: No tenderness.  Abdominal:     General: Bowel sounds are normal. There is no distension.     Palpations: Abdomen is soft. There is no mass.     Tenderness: There is no abdominal tenderness.  Musculoskeletal:        General: Normal range of motion.     Right lower leg: No edema.     Left lower leg: No edema.  Neurological:     Mental Status: He is alert and oriented to person, place, and time.  Psychiatric:        Mood and Affect: Mood normal.        Latest Ref Rng & Units 07/16/2022   10:40 AM 04/13/2020    9:43 AM 04/07/2019    4:43 PM  CMP  Glucose 70 - 99 mg/dL 91  96  92   BUN 6 - 24 mg/dL _1 Creatinine 0.76 - 1.27 mg/dL 1.10  1.36  1.12   Sodium 134 - 144 mmol/L 143  143  141   Potassium 3.5 - 5.2 mmol/L 5.1  4.2  4.4   Chloride 96 - 106 mmol/L 104  105  106   CO2 20 - 29 mmol/L _2 Calcium 8.7 - 10.2 mg/dL 9.8  9.8  9.3   Total Protein 6.0 - 8.5 g/dL 7.3  7.7  7.0   Total Bilirubin 0.0 - 1.2 mg/dL 0.7  1.0  0.8   Alkaline Phos 44 - 121 IU/L 70  69  59   AST 0 - 40 IU/L 24  22  35   ALT 0 - 44 IU/L 35  33  44     Lipid Panel     Component Value Date/Time   CHOL 162 07/16/2022 1040   TRIG 51 07/16/2022 1040   HDL 78 07/16/2022 1040   CHOLHDL 3.1 04/13/2020 0943   CHOLHDL 3.2 01/09/2017 1707   VLDL 19 01/09/2017 1707   LDLCALC  73 07/16/2022 1040    CBC    Component Value Date/Time   WBC 3.8 07/16/2022 1040   WBC 5.5 12/18/2015 0308   RBC 5.51 07/16/2022 1040   RBC 4.70 12/18/2015 0308   HGB 15.1 07/16/2022 1040   HCT 46.6 07/16/2022 1040   PLT 202  07/16/2022 1040   MCV 85 07/16/2022 1040   MCH 27.4 07/16/2022 1040   MCH 28.7 12/18/2015 0308   MCHC 32.4 07/16/2022 1040   MCHC 32.8 12/18/2015 0308   RDW 13.1 07/16/2022 1040   LYMPHSABS 1.2 07/16/2022 1040   MONOABS 0.5 12/17/2015 1556   EOSABS 0.1 07/16/2022 1040   BASOSABS 0.0 07/16/2022 1040    Lab Results  Component Value Date   HGBA1C 6.0 (H) 07/16/2022    Assessment & Plan:  1. Essential hypertension Controlled Counseled on blood pressure goal of less than 130/80, low-sodium, DASH diet, medication compliance, 150 minutes of moderate intensity exercise per week. Discussed medication compliance, adverse effects. - amLODipine (NORVASC) 5 MG tablet; Take 1 tablet (5 mg total) by mouth daily.  Dispense: 90 tablet; Refill: 1 - lisinopril (ZESTRIL) 20 MG tablet; Take 1 tablet (20 mg total) by mouth daily.  Dispense: 90 tablet; Refill: 1 - Basic Metabolic Panel  2. Paresthesia Stable - gabapentin (NEURONTIN) 300 MG capsule; Take 1 capsule (300 mg total) by mouth at bedtime.  Dispense: 90 capsule; Refill: 1  3. Mixed hyperlipidemia Controlled Low-cholesterol diet - rosuvastatin (CRESTOR) 20 MG tablet; Take 1 tablet (20 mg total) by mouth daily.  Dispense: 90 tablet; Refill: 1  4. Cigarette nicotine dependence without complication Spent 3 minutes counseling on cessation and he is willing to work on quitting Will initiate nicotine patches - nicotine (NICODERM CQ) 7 mg/24hr patch; Place 1 patch (7 mg total) onto the skin daily.  Dispense: 28 patch; Refill: 3  5. Prediabetes Labs reveal prediabetes with an A1c of 6.0.  Working on a low carbohydrate diet, exercise, weight loss is recommended in order to prevent progression to type 2 diabetes mellitus.     Meds ordered this encounter  Medications   nicotine (NICODERM CQ) 7 mg/24hr patch    Sig: Place 1 patch (7 mg total) onto the skin daily.    Dispense:  28 patch    Refill:  3   amLODipine (NORVASC) 5 MG tablet     Sig: Take 1 tablet (5 mg total) by mouth daily.    Dispense:  90 tablet    Refill:  1   gabapentin (NEURONTIN) 300 MG capsule    Sig: Take 1 capsule (300 mg total) by mouth at bedtime.    Dispense:  90 capsule    Refill:  1   lisinopril (ZESTRIL) 20 MG tablet    Sig: Take 1 tablet (20 mg total) by mouth daily.    Dispense:  90 tablet    Refill:  1   rosuvastatin (CRESTOR) 20 MG tablet    Sig: Take 1 tablet (20 mg total) by mouth daily.    Dispense:  90 tablet    Refill:  1    Follow-up: Return in about 6 months (around 04/26/2023) for Chronic medical conditions.       Charlott Rakes, MD, FAAFP. [facility] [place], [place]   10/25/2022, 1:56 PM

## 2022-10-25 NOTE — Addendum Note (Signed)
Addended by: Gomez Cleverly on: 10/25/2022 02:27 PM   Modules accepted: Orders

## 2022-10-26 LAB — BASIC METABOLIC PANEL
BUN/Creatinine Ratio: 13 (ref 9–20)
BUN: 16 mg/dL (ref 6–24)
CO2: 23 mmol/L (ref 20–29)
Calcium: 9.4 mg/dL (ref 8.7–10.2)
Chloride: 105 mmol/L (ref 96–106)
Creatinine, Ser: 1.24 mg/dL (ref 0.76–1.27)
Glucose: 93 mg/dL (ref 70–99)
Potassium: 4.2 mmol/L (ref 3.5–5.2)
Sodium: 140 mmol/L (ref 134–144)
eGFR: 68 mL/min/{1.73_m2} (ref >59)

## 2022-11-01 ENCOUNTER — Other Ambulatory Visit: Payer: Self-pay

## 2022-11-27 ENCOUNTER — Other Ambulatory Visit: Payer: Self-pay

## 2022-11-28 ENCOUNTER — Other Ambulatory Visit: Payer: Self-pay

## 2022-12-11 ENCOUNTER — Ambulatory Visit (INDEPENDENT_AMBULATORY_CARE_PROVIDER_SITE_OTHER): Payer: 59 | Admitting: Orthopaedic Surgery

## 2022-12-11 ENCOUNTER — Encounter: Payer: Self-pay | Admitting: Orthopaedic Surgery

## 2022-12-11 DIAGNOSIS — G5603 Carpal tunnel syndrome, bilateral upper limbs: Secondary | ICD-10-CM

## 2022-12-11 DIAGNOSIS — G5602 Carpal tunnel syndrome, left upper limb: Secondary | ICD-10-CM

## 2022-12-11 DIAGNOSIS — G5601 Carpal tunnel syndrome, right upper limb: Secondary | ICD-10-CM | POA: Diagnosis not present

## 2022-12-11 NOTE — Progress Notes (Signed)
Office Visit Note   Patient: Jason Pacheco           Date of Birth: [birth year]           MRN: [id number] Visit Date: 12/11/2022              Requested by: Jason Rakes, MD [place],   [id number] PCP: Jason Rakes, MD   Assessment & Plan: Visit Diagnoses:  1. Right carpal tunnel syndrome   2. Left carpal tunnel syndrome     Plan: Impression is bilateral moderate carpal tunnel syndrome.  Results reviewed with the patient and treatment options were again explained in detail.  Based on his options he is elected to move forward with a right carpal tunnel release.  He states that he has good social support to undergo surgery at this time.  Jason Pacheco will call the patient to confirm surgical time.  Follow-Up Instructions: No follow-ups on file.   Orders:  No orders of the defined types were placed in this encounter.  No orders of the defined types were placed in this encounter.     Procedures: No procedures performed   Clinical Data: No additional findings.   Subjective: Chief Complaint  Patient presents with   Right Hand - Pain   Left Hand - Pain    HPI [name] returns today for follow-up of bilateral carpal tunnel syndrome.  EMGs that were done last year showed bilateral moderate carpal tunnel syndrome. Review of Systems  Constitutional: Negative.   HENT: Negative.    Eyes: Negative.   Respiratory: Negative.    Cardiovascular: Negative.   Gastrointestinal: Negative.   Endocrine: Negative.   Genitourinary: Negative.   Skin: Negative.   Allergic/Immunologic: Negative.   Neurological: Negative.   Hematological: Negative.   Psychiatric/Behavioral: Negative.    All other systems reviewed and are negative.    Objective: Vital Signs: There were no vitals taken for this visit.  Physical Exam Vitals and nursing note reviewed.  Constitutional:      Appearance: He is well-developed.  Pulmonary:     Effort: Pulmonary effort  is normal.  Abdominal:     Palpations: Abdomen is soft.  Skin:    General: Skin is warm.  Neurological:     Mental Status: He is alert and oriented to person, place, and time.  Psychiatric:        Behavior: Behavior normal.        Thought Content: Thought content normal.        Judgment: Judgment normal.     Ortho Exam Examination of hands is unchanged. Specialty Comments:  No specialty comments available.  Imaging: No results found.   PMFS History: Patient Active Problem List   Diagnosis Date Noted   Prediabetes 10/25/2022   Mixed hyperlipidemia 12/30/2018   Alcohol abuse 12/30/2018   Tobacco abuse 03/03/2018   Left upper extremity numbness    Syncope 12/17/2015   Essential hypertension 12/17/2015   Bilateral arm pain 12/17/2015   Past Medical History:  Diagnosis Date   Hyperlipidemia    Hypertension    Neuromuscular disorder ([place])    nerve pain wrists and arms    Family History  Problem Relation Age of Onset   Hypertension Mother    Heart disease Mother    Colon cancer Neg Hx    Esophageal cancer Neg Hx    Rectal cancer Neg Hx    Stomach cancer Neg Hx     Past Surgical History:  Procedure Laterality Date   COLONOSCOPY     Social History   Occupational History   Not on file  Tobacco Use   Smoking status: Every Day    Packs/day: 1.00    Types: Cigarettes   Smokeless tobacco: Never  Vaping Use   Vaping Use: Never used  Substance and Sexual Activity   Alcohol use: Yes    Comment: 2 40 oz   Drug use: No   Sexual activity: Not on file

## 2022-12-19 ENCOUNTER — Encounter (HOSPITAL_BASED_OUTPATIENT_CLINIC_OR_DEPARTMENT_OTHER): Payer: Self-pay | Admitting: Orthopaedic Surgery

## 2022-12-24 ENCOUNTER — Encounter (HOSPITAL_BASED_OUTPATIENT_CLINIC_OR_DEPARTMENT_OTHER)
Admission: RE | Admit: 2022-12-24 | Discharge: 2022-12-24 | Disposition: A | Discharge status: Home / Self Care | Payer: 59 | Source: Ambulatory Visit | Attending: Orthopaedic Surgery | Admitting: Orthopaedic Surgery

## 2022-12-24 DIAGNOSIS — Z01812 Encounter for preprocedural laboratory examination: Secondary | ICD-10-CM | POA: Diagnosis present

## 2022-12-24 NOTE — Progress Notes (Signed)

## 2022-12-26 ENCOUNTER — Ambulatory Visit (HOSPITAL_BASED_OUTPATIENT_CLINIC_OR_DEPARTMENT_OTHER): Payer: 59 | Admitting: Anesthesiology

## 2022-12-26 ENCOUNTER — Other Ambulatory Visit: Payer: Self-pay

## 2022-12-26 ENCOUNTER — Encounter (HOSPITAL_BASED_OUTPATIENT_CLINIC_OR_DEPARTMENT_OTHER)
Admission: RE | Disposition: A | Discharge status: Home / Self Care | Payer: Self-pay | Source: Home / Self Care | Attending: Orthopaedic Surgery

## 2022-12-26 ENCOUNTER — Encounter (HOSPITAL_BASED_OUTPATIENT_CLINIC_OR_DEPARTMENT_OTHER): Payer: Self-pay | Admitting: Orthopaedic Surgery

## 2022-12-26 ENCOUNTER — Ambulatory Visit (HOSPITAL_BASED_OUTPATIENT_CLINIC_OR_DEPARTMENT_OTHER)
Admission: RE | Admit: 2022-12-26 | Discharge: 2022-12-26 | Disposition: A | Discharge status: Home / Self Care | Payer: 59 | Attending: Orthopaedic Surgery | Admitting: Orthopaedic Surgery

## 2022-12-26 DIAGNOSIS — Z01818 Encounter for other preprocedural examination: Secondary | ICD-10-CM

## 2022-12-26 DIAGNOSIS — G5601 Carpal tunnel syndrome, right upper limb: Secondary | ICD-10-CM

## 2022-12-26 DIAGNOSIS — F17210 Nicotine dependence, cigarettes, uncomplicated: Secondary | ICD-10-CM

## 2022-12-26 DIAGNOSIS — I10 Essential (primary) hypertension: Secondary | ICD-10-CM

## 2022-12-26 DIAGNOSIS — G709 Myoneural disorder, unspecified: Secondary | ICD-10-CM | POA: Diagnosis not present

## 2022-12-26 HISTORY — PX: CARPAL TUNNEL RELEASE: SHX101

## 2022-12-26 SURGERY — CARPAL TUNNEL RELEASE
Anesthesia: Monitor Anesthesia Care | Site: Wrist | Laterality: Right

## 2022-12-26 MED ORDER — ACETAMINOPHEN 500 MG PO TABS
ORAL_TABLET | ORAL | Status: AC
  Filled 2022-12-26: qty 2

## 2022-12-26 MED ORDER — PROPOFOL 500 MG/50ML IV EMUL
INTRAVENOUS | Status: DC | PRN
  Administered 2022-12-26: 125 ug/kg/min via INTRAVENOUS

## 2022-12-26 MED ORDER — LIDOCAINE HCL (CARDIAC) PF 100 MG/5ML IV SOSY
PREFILLED_SYRINGE | INTRAVENOUS | Status: DC | PRN
  Administered 2022-12-26: 50 mg via INTRAVENOUS

## 2022-12-26 MED ORDER — KETOROLAC TROMETHAMINE 30 MG/ML IJ SOLN
INTRAMUSCULAR | Status: DC | PRN
  Administered 2022-12-26: 30 mg via INTRAVENOUS

## 2022-12-26 MED ORDER — KETOROLAC TROMETHAMINE 30 MG/ML IJ SOLN
INTRAMUSCULAR | Status: AC
  Filled 2022-12-26: qty 1

## 2022-12-26 MED ORDER — OXYCODONE HCL 5 MG/5ML PO SOLN: 5.0000 mg | Freq: Once | ORAL | Status: DC | PRN

## 2022-12-26 MED ORDER — DEXAMETHASONE SODIUM PHOSPHATE 4 MG/ML IJ SOLN: INTRAMUSCULAR | Status: DC | PRN

## 2022-12-26 MED ORDER — LIDOCAINE-EPINEPHRINE (PF) 1 %-1:200000 IJ SOLN
INTRAMUSCULAR | Status: DC | PRN
  Administered 2022-12-26: 8 mL via INTRAMUSCULAR

## 2022-12-26 MED ORDER — FENTANYL CITRATE (PF) 100 MCG/2ML IJ SOLN: 25.0000 ug | INTRAMUSCULAR | Status: DC | PRN

## 2022-12-26 MED ORDER — MIDAZOLAM HCL 2 MG/2ML IJ SOLN
INTRAMUSCULAR | Status: AC
  Filled 2022-12-26: qty 2

## 2022-12-26 MED ORDER — TRAMADOL HCL 50 MG PO TABS
50.0000 mg | ORAL_TABLET | Freq: Two times a day (BID) | ORAL | 0 refills | Status: AC | PRN
  Filled 2022-12-26: qty 10, 5d supply, fill #0

## 2022-12-26 MED ORDER — CEFAZOLIN SODIUM-DEXTROSE 2-4 GM/100ML-% IV SOLN
INTRAVENOUS | Status: AC
  Filled 2022-12-26: qty 100

## 2022-12-26 MED ORDER — LACTATED RINGERS IV SOLN: INTRAVENOUS | Status: DC

## 2022-12-26 MED ORDER — PROPOFOL 10 MG/ML IV BOLUS
INTRAVENOUS | Status: DC | PRN
  Administered 2022-12-26: 20 mg via INTRAVENOUS

## 2022-12-26 MED ORDER — AMISULPRIDE (ANTIEMETIC) 5 MG/2ML IV SOLN: 10.0000 mg | Freq: Once | INTRAVENOUS | Status: DC | PRN

## 2022-12-26 MED ORDER — MIDAZOLAM HCL 5 MG/5ML IJ SOLN
INTRAMUSCULAR | Status: DC | PRN
  Administered 2022-12-26: 2 mg via INTRAVENOUS

## 2022-12-26 MED ORDER — FENTANYL CITRATE (PF) 100 MCG/2ML IJ SOLN
INTRAMUSCULAR | Status: AC
  Filled 2022-12-26: qty 2

## 2022-12-26 MED ORDER — OXYCODONE HCL 5 MG PO TABS: 5.0000 mg | ORAL_TABLET | Freq: Once | ORAL | Status: DC | PRN

## 2022-12-26 MED ORDER — FENTANYL CITRATE (PF) 100 MCG/2ML IJ SOLN
INTRAMUSCULAR | Status: DC | PRN
  Administered 2022-12-26: 50 ug via INTRAVENOUS

## 2022-12-26 MED ORDER — ONDANSETRON HCL 4 MG/2ML IJ SOLN
INTRAMUSCULAR | Status: DC | PRN
  Administered 2022-12-26: 4 mg via INTRAVENOUS

## 2022-12-26 MED ORDER — ACETAMINOPHEN 500 MG PO TABS
1000.0000 mg | ORAL_TABLET | Freq: Once | ORAL | Status: AC
  Administered 2022-12-26: 1000 mg via ORAL

## 2022-12-26 MED ORDER — PROMETHAZINE HCL 25 MG/ML IJ SOLN: 6.2500 mg | INTRAMUSCULAR | Status: DC | PRN

## 2022-12-26 MED ORDER — KETOROLAC TROMETHAMINE 30 MG/ML IJ SOLN: 30.0000 mg | Freq: Once | INTRAMUSCULAR | Status: DC | PRN

## 2022-12-26 MED ORDER — CEFAZOLIN SODIUM-DEXTROSE 2-4 GM/100ML-% IV SOLN
2.0000 g | INTRAVENOUS | Status: AC
  Administered 2022-12-26: 2 g via INTRAVENOUS

## 2022-12-26 SURGICAL SUPPLY — 47 items
BAND INSRT 18 STRL LF DISP RB (MISCELLANEOUS) ×1
BAND RUBBER #18 3X1/16 STRL (MISCELLANEOUS) ×2 IMPLANT
BLADE MINI RND TIP GREEN BEAV (BLADE) ×1 IMPLANT
BLADE SURG 15 STRL LF DISP TIS (BLADE) ×1 IMPLANT
BLADE SURG 15 STRL SS (BLADE) ×1
BNDG CMPR 9X4 STRL LF SNTH (GAUZE/BANDAGES/DRESSINGS) ×1
BNDG ELASTIC 3X5.8 VLCR STR LF (GAUZE/BANDAGES/DRESSINGS) ×1 IMPLANT
BNDG ESMARK 4X9 LF (GAUZE/BANDAGES/DRESSINGS) ×1 IMPLANT
BNDG PLASTER X FAST 3X3 WHT LF (CAST SUPPLIES) IMPLANT
BNDG PLSTR 9X3 FST ST WHT (CAST SUPPLIES)
BRUSH SCRUB EZ PLAIN DRY (MISCELLANEOUS) ×1 IMPLANT
CANISTER SUCT 1200ML W/VALVE (MISCELLANEOUS) ×1 IMPLANT
CORD BIPOLAR FORCEPS 12FT (ELECTRODE) ×1 IMPLANT
COVER BACK TABLE 60X90IN (DRAPES) ×1 IMPLANT
COVER MAYO STAND STRL (DRAPES) ×1 IMPLANT
CUFF TOURN SGL QUICK 18X4 (TOURNIQUET CUFF) IMPLANT
DRAPE EXTREMITY T 121X128X90 (DISPOSABLE) ×1 IMPLANT
DRAPE IMP U-DRAPE 54X76 (DRAPES) ×1 IMPLANT
DRAPE SURG 17X23 STRL (DRAPES) ×1 IMPLANT
GAUZE 4X4 16PLY ~~LOC~~+RFID DBL (SPONGE) IMPLANT
GAUZE SPONGE 4X4 12PLY STRL (GAUZE/BANDAGES/DRESSINGS) ×1 IMPLANT
GAUZE XEROFORM 1X8 LF (GAUZE/BANDAGES/DRESSINGS) ×1 IMPLANT
GLOVE BIOGEL PI IND STRL 7.5 (GLOVE) ×1 IMPLANT
GLOVE ECLIPSE 7.0 STRL STRAW (GLOVE) ×1 IMPLANT
GLOVE INDICATOR 7.0 STRL GRN (GLOVE) ×1 IMPLANT
GLOVE SURG SYN 7.5  E (GLOVE) ×1
GLOVE SURG SYN 7.5 E (GLOVE) ×1 IMPLANT
GLOVE SURG SYN 7.5 PF PI (GLOVE) ×1 IMPLANT
GOWN STRL REUS W/ TWL LRG LVL3 (GOWN DISPOSABLE) ×1 IMPLANT
GOWN STRL REUS W/TWL LRG LVL3 (GOWN DISPOSABLE) ×1
GOWN STRL SURGICAL XL XLNG (GOWN DISPOSABLE) ×2 IMPLANT
NDL HYPO 25X1 1.5 SAFETY (NEEDLE) IMPLANT
NEEDLE HYPO 25X1 1.5 SAFETY (NEEDLE) ×1 IMPLANT
NS IRRIG 1000ML POUR BTL (IV SOLUTION) ×1 IMPLANT
PACK BASIN DAY SURGERY FS (CUSTOM PROCEDURE TRAY) ×1 IMPLANT
PAD CAST 3X4 CTTN HI CHSV (CAST SUPPLIES) ×1 IMPLANT
PADDING CAST COTTON 3X4 STRL (CAST SUPPLIES) ×1
SHEET MEDIUM DRAPE 40X70 STRL (DRAPES) ×1 IMPLANT
SPIKE FLUID TRANSFER (MISCELLANEOUS) IMPLANT
STOCKINETTE 4X48 STRL (DRAPES) ×1 IMPLANT
SUT ETHILON 3 0 PS 1 (SUTURE) ×1 IMPLANT
SYR BULB EAR ULCER 3OZ GRN STR (SYRINGE) ×1 IMPLANT
SYR CONTROL 10ML LL (SYRINGE) IMPLANT
TOWEL GREEN STERILE FF (TOWEL DISPOSABLE) ×1 IMPLANT
TRAY DSU PREP LF (CUSTOM PROCEDURE TRAY) ×1 IMPLANT
TUBE CONNECTING 20X1/4 (TUBING) IMPLANT
UNDERPAD 30X36 HEAVY ABSORB (UNDERPADS AND DIAPERS) ×1 IMPLANT

## 2022-12-26 NOTE — Discharge Instructions (Addendum)
Post Anesthesia Home Care Instructions  Activity: Get plenty of rest for the remainder of the day. A responsible individual must stay with you for 24 hours following the procedure.  For the next 24 hours, DO NOT: -Drive a car -Paediatric nurse -Drink alcoholic beverages -Take any medication unless instructed by your physician -Make any legal decisions or sign important papers.  Meals: Start with liquid foods such as gelatin or soup. Progress to regular foods as tolerated. Avoid greasy, spicy, heavy foods. If nausea and/or vomiting occur, drink only clear liquids until the nausea and/or vomiting subsides. Call your physician if vomiting continues.  Special Instructions/Symptoms: Your throat may feel dry or sore from the anesthesia or the breathing tube placed in your throat during surgery. If this causes discomfort, gargle with warm salt water. The discomfort should disappear within 24 hours.  If you had a scopolamine patch placed behind your ear for the management of post- operative nausea and/or vomiting:  1. The medication in the patch is effective for 72 hours, after which it should be removed.  Wrap patch in a tissue and discard in the trash. Wash hands thoroughly with soap and water. 2. You may remove the patch earlier than 72 hours if you experience unpleasant side effects which may include dry mouth, dizziness or visual disturbances. 3. Avoid touching the patch. Wash your hands with soap and water after contact with the patch.    Next dose ofTylenol may be taken at 5p     Postoperative instructions:  Weightbearing instructions: don't lift more than 10 lbs for 4 weeks after surgery.  Dressing instructions: Keep your dressing and/or splint clean and dry at all times.  It will be removed at your first post-operative appointment.  Your stitches and/or staples will be removed at this visit.  Incision instructions:  Do not soak your incision for 3 weeks after surgery.  If the  incision gets wet, pat dry and do not scrub the incision.  Pain control:  You have been given a prescription to be taken as directed for post-operative pain control.  In addition, elevate the operative extremity above the heart at all times to prevent swelling and throbbing pain.  Take over-the-counter Colace, '100mg'$  by mouth twice a day while taking narcotic pain medications to help prevent constipation.  Follow up appointments: 1) 10 days for suture removal and wound check. 2) Dr. Erlinda Hong as scheduled.   -------------------------------------------------------------------------------------------------------------  After Surgery Pain Control:  After your surgery, post-surgical discomfort or pain is likely. This discomfort can last several days to a few weeks. At certain times of the day your discomfort may be more intense.  Did you receive a nerve block?  A nerve block can provide pain relief for one hour to two days after your surgery. As long as the nerve block is working, you will experience little or no sensation in the area the surgeon operated on.  As the nerve block wears off, you will begin to experience pain or discomfort. It is very important that you begin taking your prescribed pain medication before the nerve block fully wears off. Treating your pain at the first sign of the block wearing off will ensure your pain is better controlled and more tolerable when full-sensation returns. Do not wait until the pain is intolerable, as the medicine will be less effective. It is better to treat pain in advance than to try and catch up.  General Anesthesia:  If you did not receive a nerve block during your  surgery, you will need to start taking your pain medication shortly after your surgery and should continue to do so as prescribed by your surgeon.  Pain Medication:  Most commonly we prescribe Vicodin and Percocet for post-operative pain. Both of these medications contain a combination of  acetaminophen (Tylenol) and a narcotic to help control pain.   It takes between 30 and 45 minutes before pain medication starts to work. It is important to take your medication before your pain level gets too intense.   Nausea is a common side effect of many pain medications. You will want to eat something before taking your pain medicine to help prevent nausea.   If you are taking a prescription pain medication that contains acetaminophen, we recommend that you do not take additional over the counter acetaminophen (Tylenol).  Other pain relieving options:   Using a cold pack to ice the affected area a few times a day (15 to 20 minutes at a time) can help to relieve pain, reduce swelling and bruising.   Elevation of the affected area can also help to reduce pain and swelling.

## 2022-12-26 NOTE — Anesthesia Preprocedure Evaluation (Addendum)
Anesthesia Evaluation  Patient identified by MRN, date of birth, ID band Patient awake    Reviewed: Allergy & Precautions, NPO status , Patient's Chart, lab work & pertinent test results  Airway Mallampati: II  TM Distance: >3 FB Neck ROM: Full    Dental no notable dental hx.    Pulmonary Current Smoker and Patient abstained from smoking.   Pulmonary exam normal        Cardiovascular hypertension, Pt. on medications Normal cardiovascular exam     Neuro/Psych  Neuromuscular disease  negative psych ROS   GI/Hepatic negative GI ROS,,,(+)     substance abuse    Endo/Other  negative endocrine ROS    Renal/GU negative Renal ROS     Musculoskeletal negative musculoskeletal ROS (+)    Abdominal   Peds  Hematology negative hematology ROS (+)   Anesthesia Other Findings right carpal tunnel syndrome  Reproductive/Obstetrics                             Anesthesia Physical Anesthesia Plan  ASA: 2  Anesthesia Plan: MAC   Post-op Pain Management:    Induction: Intravenous  PONV Risk Score and Plan: 0 and Ondansetron, Dexamethasone, Propofol infusion, Midazolam and Treatment may vary due to age or medical condition  Airway Management Planned: Simple Face Mask  Additional Equipment:   Intra-op Plan:   Post-operative Plan:   Informed Consent: I have reviewed the patients History and Physical, chart, labs and discussed the procedure including the risks, benefits and alternatives for the proposed anesthesia with the patient or authorized representative who has indicated his/her understanding and acceptance.     Dental advisory given  Plan Discussed with: CRNA  Anesthesia Plan Comments:        Anesthesia Quick Evaluation

## 2022-12-26 NOTE — Op Note (Signed)
   Carpal tunnel op note  DATE OF SURGERY:12/26/2022  PREOPERATIVE DIAGNOSIS:  Right carpal tunnel syndrome  POSTOPERATIVE DIAGNOSIS: same  PROCEDURE: Right carpal tunnel release. CPT [id number]  SURGEON: Marianna Payment, M.D.  ASSIST: Madalyn Rob, [place]  ANESTHESIA:  Local and MAC  TOURNIQUET TIME: less than 20 minutes  BLOOD LOSS: Minimal.  COMPLICATIONS: None.  PATHOLOGY: None.  INDICATIONS: The patient is a 57 y.o. -year-old male who presented with carpal tunnel syndrome failing nonsurgical management, indicated for surgical release.  DESCRIPTION OF PROCEDURE: The patient was identified in the preoperative holding area.  The operative site was marked by the surgeon and confirmed by the patient.  The patient was brought back to the operating room.  MAC anesthesia was administered.  Local anesthetic with epi was injected into the operative site.  A well padded nonsterile tourniquet was placed. The operative extremity was prepped and draped in standard sterile fashion.  A timeout was performed.  Preoperative antibiotics were given.   A palmar incision was made about 5 mm ulnar to the thenar crease.  The palmar aponeurosis was exposed and divided in line with the skin incision. The palmaris brevis was visualized and divided.  The distal edge of the transcarpal ligament was identified. A hemostat was inserted into the carpal tunnel to protect the median nerve and the flexor tendons. Then, the transverse carpal ligament was released under direct visualization. Proximally, a subcutaneous tunnel was made allowing a Sewell retractor to be placed. Then, the distal portion of the antebrachial fascia was released. Distally, all fibrous bands were released. The median nerve was visualized, and the fat pad was exposed. Wound was irrigated and closed with 4-0 nylon sutures. Sterile dressing applied. The patient was transferred to the recovery room in stable condition after all counts were  correct.  POSTOPERATIVE PLAN: To start nerve gliding exercises as tolerated and no heavy lifting for four weeks.  Eduard Roux, M.D. [facility] 1:11 PM

## 2022-12-26 NOTE — Anesthesia Postprocedure Evaluation (Signed)
Anesthesia Post Note  Patient: Jason Pacheco  Procedure(s) Performed: RIGHT CARPAL TUNNEL RELEASE (Right: Wrist)     Patient location during evaluation: PACU Anesthesia Type: MAC Level of consciousness: awake Pain management: pain level controlled Vital Signs Assessment: post-procedure vital signs reviewed and stable Respiratory status: spontaneous breathing, nonlabored ventilation and respiratory function stable Cardiovascular status: blood pressure returned to baseline and stable Postop Assessment: no apparent nausea or vomiting Anesthetic complications: no   No notable events documented.  Last Vitals:  Vitals:   12/26/22 1345 12/26/22 1357  BP: (!) 123/92 138/85  Pulse: 60 (!) 59  Resp: 14 16  Temp:  (!) 36.2 C  SpO2: 97% 100%    Last Pain:  Vitals:   12/26/22 1357  TempSrc:   PainSc: 0-No pain                 Merritt Kibby P Camari Wisham

## 2022-12-26 NOTE — Transfer of Care (Signed)
Immediate Anesthesia Transfer of Care Note  Patient: Jason Pacheco  Procedure(s) Performed: RIGHT CARPAL TUNNEL RELEASE (Right: Wrist)  Patient Location: PACU  Anesthesia Type:MAC  Level of Consciousness: awake, alert , and oriented  Airway & Oxygen Therapy: Patient Spontanous Breathing and Patient connected to face mask oxygen  Post-op Assessment: Report given to RN and Post -op Vital signs reviewed and stable  Post vital signs: Reviewed and stable  Last Vitals:  Vitals Value Taken Time  BP    Temp    Pulse 63 12/26/22 1324  Resp 15 12/26/22 1324  SpO2 96 % 12/26/22 1324  Vitals shown include unvalidated device data.  Last Pain:  Vitals:   12/26/22 1117  TempSrc: Oral  PainSc: 10-Worst pain ever      Patients Stated Pain Goal: 6 ([id number])  Complications: No notable events documented.

## 2022-12-26 NOTE — H&P (Signed)
PREOPERATIVE H&P  Chief Complaint: right carpal tunnel syndrome  HPI: Jason Pacheco is a 57 y.o. male who presents for surgical treatment of right carpal tunnel syndrome.  He denies any changes in medical history.  Past Medical History:  Diagnosis Date   Hyperlipidemia    Hypertension    Neuromuscular disorder ([place])    nerve pain wrists and arms   Past Surgical History:  Procedure Laterality Date   COLONOSCOPY     Social History   Socioeconomic History   Marital status: Single    Spouse name: Not on file   Number of children: Not on file   Years of education: Not on file   Highest education level: Not on file  Occupational History   Not on file  Tobacco Use   Smoking status: Every Day    Packs/day: 1.00    Types: Cigarettes   Smokeless tobacco: Never  Vaping Use   Vaping Use: Never used  Substance and Sexual Activity   Alcohol use: Yes    Comment: 2 40 oz   Drug use: No   Sexual activity: Not on file  Other Topics Concern   Not on file  Social History Narrative   Not on file   Social Determinants of Health   Financial Resource Strain: Not on file  Food Insecurity: Not on file  Transportation Needs: Not on file  Physical Activity: Not on file  Stress: Not on file  Social Connections: Not on file   Family History  Problem Relation Age of Onset   Hypertension Mother    Heart disease Mother    Colon cancer Neg Hx    Esophageal cancer Neg Hx    Rectal cancer Neg Hx    Stomach cancer Neg Hx    No Known Allergies Prior to Admission medications   Medication Sig Start Date End Date Taking? Authorizing Provider  amLODipine (NORVASC) 5 MG tablet Take 1 tablet (5 mg total) by mouth daily. 10/25/22  Yes Charlott Rakes, MD  gabapentin (NEURONTIN) 300 MG capsule Take 1 capsule (300 mg total) by mouth at bedtime. 10/25/22  Yes Charlott Rakes, MD  lisinopril (ZESTRIL) 20 MG tablet Take 1 tablet (20 mg total) by mouth daily. 10/25/22  Yes Charlott Rakes, MD   rosuvastatin (CRESTOR) 20 MG tablet Take 1 tablet (20 mg total) by mouth daily. 10/25/22  Yes Charlott Rakes, MD  Vitamin D, Ergocalciferol, (DRISDOL) 1.25 MG (50000 UNIT) CAPS capsule TAKE ONE TABLE BY MOUTH ONCE A WEEK. FOR 12 WEEKS. 04/15/20  Yes Argentina Donovan, PA-C  aspirin EC 81 MG tablet Take 1 tablet (81 mg total) by mouth daily. 03/11/17   Alfonse Spruce, FNP  nicotine (NICODERM CQ) 7 mg/24hr patch Place 1 patch (7 mg total) onto the skin daily. 10/25/22   Charlott Rakes, MD  PEG-KCl-NaCl-NaSulf-Na Asc-C (PLENVU) 140 g SOLR Take 1 kit by mouth once. Colonoscopy prep sample provided    [provider]     Positive ROS: All other systems have been reviewed and were otherwise negative with the exception of those mentioned in the HPI and as above.  Physical Exam: General: Alert, no acute distress Cardiovascular: No pedal edema Respiratory: No cyanosis, no use of accessory musculature GI: abdomen soft Skin: No lesions in the area of chief complaint Neurologic: Sensation intact distally Psychiatric: Patient is competent for consent with normal mood and affect Lymphatic: no lymphedema  MUSCULOSKELETAL: exam stable  Assessment: right carpal tunnel syndrome  Plan: Plan for  Procedure(s): RIGHT CARPAL TUNNEL RELEASE  The risks benefits and alternatives were discussed with the patient including but not limited to the risks of nonoperative treatment, versus surgical intervention including infection, bleeding, nerve injury,  blood clots, cardiopulmonary complications, morbidity, mortality, among others, and they were willing to proceed.   Eduard Roux, MD 12/26/2022 11:10 AM

## 2022-12-27 ENCOUNTER — Encounter (HOSPITAL_BASED_OUTPATIENT_CLINIC_OR_DEPARTMENT_OTHER): Payer: Self-pay | Admitting: Orthopaedic Surgery

## 2023-01-01 ENCOUNTER — Other Ambulatory Visit: Payer: Self-pay

## 2023-01-04 ENCOUNTER — Ambulatory Visit (INDEPENDENT_AMBULATORY_CARE_PROVIDER_SITE_OTHER): Payer: 59 | Admitting: Physician Assistant

## 2023-01-04 ENCOUNTER — Encounter: Payer: Self-pay | Admitting: Orthopaedic Surgery

## 2023-01-04 DIAGNOSIS — G5601 Carpal tunnel syndrome, right upper limb: Secondary | ICD-10-CM

## 2023-01-04 DIAGNOSIS — Z9889 Other specified postprocedural states: Secondary | ICD-10-CM

## 2023-01-04 NOTE — Progress Notes (Signed)
   Post-Op Visit Note   Patient: Jason Pacheco           Date of Birth: 04/14/1966           MRN: [id number] Visit Date: 01/04/2023 PCP: Charlott Rakes, MD   Assessment & Plan:  Chief Complaint:  Chief Complaint  Patient presents with   Right Hand - Routine Post Op   Visit Diagnoses:  1. Carpal tunnel syndrome on right   2. S/P carpal tunnel release     Plan: Patient is a pleasant 57 year old gentleman who comes in today 9 days status post right carpal tunnel release 12/26/2022.  This was for moderate compression of the median nerve.  He has been doing well.  He is taking Tylenol for pain.  He denies any paresthesias.  Examination of his right hand reveals a well-healing surgical incision with nylon sutures in place.  No evidence of infection or cellulitis.  Fingers are warm and well-perfused.  Today, the wound was cleaned and recovered.  We have provided him with a removable Velcro splint.  He will wear this at all times for the next week.  No heavy lifting or submerging his hand underwater for another 3 weeks.  Out of work note provided from day of surgery for 2 weeks.  Follow-up next week for suture removal.  Call with concerns or questions.  Follow-Up Instructions: Return in about 1 week (around 01/11/2023).   Orders:  No orders of the defined types were placed in this encounter.  No orders of the defined types were placed in this encounter.   Imaging: No new imaging  PMFS History: Patient Active Problem List   Diagnosis Date Noted   Carpal tunnel syndrome on right 12/26/2022   Prediabetes 10/25/2022   Mixed hyperlipidemia 12/30/2018   Alcohol abuse 12/30/2018   Tobacco abuse 03/03/2018   Left upper extremity numbness    Syncope 12/17/2015   Essential hypertension 12/17/2015   Bilateral arm pain 12/17/2015   Past Medical History:  Diagnosis Date   Hyperlipidemia    Hypertension    Neuromuscular disorder ([place])    nerve pain wrists and arms    Family History   Problem Relation Age of Onset   Hypertension Mother    Heart disease Mother    Colon cancer Neg Hx    Esophageal cancer Neg Hx    Rectal cancer Neg Hx    Stomach cancer Neg Hx     Past Surgical History:  Procedure Laterality Date   CARPAL TUNNEL RELEASE Right 12/26/2022   Procedure: RIGHT CARPAL TUNNEL RELEASE;  Surgeon: Leandrew Koyanagi, MD;  Location: [facility];  Service: Orthopedics;  Laterality: Right;   COLONOSCOPY     Social History   Occupational History   Not on file  Tobacco Use   Smoking status: Every Day    Packs/day: 1.00    Types: Cigarettes   Smokeless tobacco: Never  Vaping Use   Vaping Use: Never used  Substance and Sexual Activity   Alcohol use: Yes    Comment: 2 40 oz   Drug use: No   Sexual activity: Not on file

## 2023-01-10 ENCOUNTER — Encounter: Payer: Self-pay | Admitting: Orthopaedic Surgery

## 2023-01-10 ENCOUNTER — Ambulatory Visit (INDEPENDENT_AMBULATORY_CARE_PROVIDER_SITE_OTHER): Payer: 59 | Admitting: Physician Assistant

## 2023-01-10 DIAGNOSIS — Z9889 Other specified postprocedural states: Secondary | ICD-10-CM

## 2023-01-10 NOTE — Progress Notes (Signed)
   Post-Op Visit Note   Patient: Jason Pacheco           Date of Birth: Oct 14, 1966           MRN: [id number] Visit Date: 01/10/2023 PCP: Charlott Rakes, MD   Assessment & Plan:  Chief Complaint:  Chief Complaint  Patient presents with   Right Wrist - Follow-up    Right carpal tunnel release 12/26/2022   Visit Diagnoses:  1. S/P carpal tunnel release     Plan: Patient is a pleasant 57 year old gentleman who comes in today 2 weeks status post right carpal tunnel release 12/26/2022.  He has been doing great.  No pain or paresthesias.  Examination of his right hand reveals a fully healed surgical scar with nylon sutures in place.  No evidence of infection or cellulitis.  Fingers warm and well-perfused.  He is neurovascular intact distally.  Today, sutures were removed and Steri-Strips applied.  No heavy lifting or submerging his hand underwater for another 2 weeks.  We have provided him with a work note to return next Monday no lifting greater than 2 pounds to the right upper extremity for 2 weeks.  Follow-up with [place] in 2 weeks for recheck.  Call with concerns or questions.  Follow-Up Instructions: Return in about 2 weeks (around 01/24/2023).   Orders:  No orders of the defined types were placed in this encounter.  No orders of the defined types were placed in this encounter.   Imaging: No new imaging  PMFS History: Patient Active Problem List   Diagnosis Date Noted   Carpal tunnel syndrome on right 12/26/2022   Prediabetes 10/25/2022   Mixed hyperlipidemia 12/30/2018   Alcohol abuse 12/30/2018   Tobacco abuse 03/03/2018   Left upper extremity numbness    Syncope 12/17/2015   Essential hypertension 12/17/2015   Bilateral arm pain 12/17/2015   Past Medical History:  Diagnosis Date   Hyperlipidemia    Hypertension    Neuromuscular disorder ([place])    nerve pain wrists and arms    Family History  Problem Relation Age of Onset   Hypertension Mother    Heart disease Mother     Colon cancer Neg Hx    Esophageal cancer Neg Hx    Rectal cancer Neg Hx    Stomach cancer Neg Hx     Past Surgical History:  Procedure Laterality Date   CARPAL TUNNEL RELEASE Right 12/26/2022   Procedure: RIGHT CARPAL TUNNEL RELEASE;  Surgeon: Leandrew Koyanagi, MD;  Location: [facility];  Service: Orthopedics;  Laterality: Right;   COLONOSCOPY     Social History   Occupational History   Not on file  Tobacco Use   Smoking status: Every Day    Packs/day: 1    Types: Cigarettes   Smokeless tobacco: Never  Vaping Use   Vaping Use: Never used  Substance and Sexual Activity   Alcohol use: Yes    Comment: 2 40 oz   Drug use: No   Sexual activity: Not on file

## 2023-01-17 ENCOUNTER — Telehealth: Payer: Self-pay | Admitting: Orthopaedic Surgery

## 2023-01-17 NOTE — Telephone Encounter (Signed)
Need a not to return to work for Monday please advise

## 2023-01-18 NOTE — Telephone Encounter (Signed)
No lifting greater than 2 pounds until his next f/u appt which should be when he is 4 weeks po

## 2023-01-29 ENCOUNTER — Encounter: Payer: Self-pay | Admitting: Orthopaedic Surgery

## 2023-01-29 ENCOUNTER — Ambulatory Visit (INDEPENDENT_AMBULATORY_CARE_PROVIDER_SITE_OTHER): Payer: 59 | Admitting: Orthopaedic Surgery

## 2023-01-29 DIAGNOSIS — Z9889 Other specified postprocedural states: Secondary | ICD-10-CM

## 2023-01-29 DIAGNOSIS — G5601 Carpal tunnel syndrome, right upper limb: Secondary | ICD-10-CM

## 2023-01-29 NOTE — Progress Notes (Signed)
   Post-Op Visit Note   Patient: Jason Pacheco           Date of Birth: December 08, 1965           MRN: [id number] Visit Date: 01/29/2023 PCP: Jason Rakes, MD   Assessment & Plan:  Chief Complaint:  Chief Complaint  Patient presents with   Right Wrist - Follow-up    Right carpal tunnel release 12/26/2022   Visit Diagnoses:  1. S/P carpal tunnel release   2. Right carpal tunnel syndrome     Plan: Jason Pacheco is 5 weeks status post right carpal tunnel release.  He is doing well overall.  Examination right hand shows fully healed surgical scar.  At this point he has made a full recovery.  He would like to be released back to work without restrictions.  Will see him back as needed.  He will follow-up with [place] when he is ready to schedule left carpal tunnel release.  Follow-Up Instructions: No follow-ups on file.   Orders:  No orders of the defined types were placed in this encounter.  No orders of the defined types were placed in this encounter.   Imaging: No results found.  PMFS History: Patient Active Problem List   Diagnosis Date Noted   Carpal tunnel syndrome on right 12/26/2022   Prediabetes 10/25/2022   Mixed hyperlipidemia 12/30/2018   Alcohol abuse 12/30/2018   Tobacco abuse 03/03/2018   Left upper extremity numbness    Syncope 12/17/2015   Essential hypertension 12/17/2015   Bilateral arm pain 12/17/2015   Past Medical History:  Diagnosis Date   Hyperlipidemia    Hypertension    Neuromuscular disorder    nerve pain wrists and arms    Family History  Problem Relation Age of Onset   Hypertension Mother    Heart disease Mother    Colon cancer Neg Hx    Esophageal cancer Neg Hx    Rectal cancer Neg Hx    Stomach cancer Neg Hx     Past Surgical History:  Procedure Laterality Date   CARPAL TUNNEL RELEASE Right 12/26/2022   Procedure: RIGHT CARPAL TUNNEL RELEASE;  Surgeon: Leandrew Koyanagi, MD;  Location: [facility];  Service: Orthopedics;   Laterality: Right;   COLONOSCOPY     Social History   Occupational History   Not on file  Tobacco Use   Smoking status: Every Day    Packs/day: 1    Types: Cigarettes   Smokeless tobacco: Never  Vaping Use   Vaping Use: Never used  Substance and Sexual Activity   Alcohol use: Yes    Comment: 2 40 oz   Drug use: No   Sexual activity: Not on file

## 2023-04-24 ENCOUNTER — Ambulatory Visit: Payer: Commercial Managed Care - HMO | Admitting: Family Medicine

## 2023-05-15 ENCOUNTER — Other Ambulatory Visit: Payer: Self-pay

## 2023-05-15 ENCOUNTER — Other Ambulatory Visit: Payer: Self-pay | Admitting: Family Medicine

## 2023-05-15 ENCOUNTER — Encounter: Payer: Self-pay | Admitting: Family Medicine

## 2023-05-15 ENCOUNTER — Ambulatory Visit: Payer: 59 | Attending: Family Medicine | Admitting: Family Medicine

## 2023-05-15 VITALS — BP 132/81 | HR 66 | Temp 97.5°F | Ht 69.0 in | Wt 183.8 lb

## 2023-05-15 DIAGNOSIS — R399 Unspecified symptoms and signs involving the genitourinary system: Secondary | ICD-10-CM | POA: Diagnosis not present

## 2023-05-15 DIAGNOSIS — E782 Mixed hyperlipidemia: Secondary | ICD-10-CM | POA: Diagnosis not present

## 2023-05-15 DIAGNOSIS — R7303 Prediabetes: Secondary | ICD-10-CM | POA: Diagnosis not present

## 2023-05-15 DIAGNOSIS — I10 Essential (primary) hypertension: Secondary | ICD-10-CM

## 2023-05-15 DIAGNOSIS — F17210 Nicotine dependence, cigarettes, uncomplicated: Secondary | ICD-10-CM

## 2023-05-15 LAB — POCT GLYCOSYLATED HEMOGLOBIN (HGB A1C): HbA1c, POC (prediabetic range): 5.6 % — AB (ref 5.7–6.4)

## 2023-05-15 MED ORDER — TAMSULOSIN HCL 0.4 MG PO CAPS
0.4000 mg | ORAL_CAPSULE | Freq: Every day | ORAL | 1 refills | Status: AC
  Filled 2023-05-15: qty 90, 90d supply, fill #0

## 2023-05-15 MED ORDER — LISINOPRIL 20 MG PO TABS
20.0000 mg | ORAL_TABLET | Freq: Every day | ORAL | 1 refills | Status: DC
  Filled 2023-05-15: qty 90, 90d supply, fill #0
  Filled 2023-05-16: qty 30, 30d supply, fill #0
  Filled 2023-07-24: qty 30, 30d supply, fill #1
  Filled 2023-08-28: qty 30, 30d supply, fill #2
  Filled 2023-09-27: qty 30, 30d supply, fill #3
  Filled 2023-10-24: qty 30, 30d supply, fill #4
  Filled 2023-11-28: qty 30, 30d supply, fill #5

## 2023-05-15 MED ORDER — ROSUVASTATIN CALCIUM 20 MG PO TABS
20.0000 mg | ORAL_TABLET | Freq: Every day | ORAL | 1 refills | Status: DC
  Filled 2023-05-15 – 2023-05-20 (×2): qty 30, 30d supply, fill #0

## 2023-05-15 MED ORDER — AMLODIPINE BESYLATE 5 MG PO TABS
5.0000 mg | ORAL_TABLET | Freq: Every day | ORAL | 1 refills | Status: DC
  Filled 2023-05-15: qty 90, 90d supply, fill #0
  Filled 2023-05-16: qty 30, 30d supply, fill #0
  Filled 2023-07-24: qty 30, 30d supply, fill #1
  Filled 2023-08-28: qty 30, 30d supply, fill #2
  Filled 2023-09-27: qty 30, 30d supply, fill #3
  Filled 2023-10-24: qty 30, 30d supply, fill #4
  Filled 2023-11-28: qty 30, 30d supply, fill #5

## 2023-05-15 NOTE — Patient Instructions (Signed)

## 2023-05-15 NOTE — Progress Notes (Signed)
Subjective:  Patient ID: Jason Pacheco, male    DOB: 1966/05/02  Age: 57 y.o. MRN: [id number]  CC: Hypertension   HPI Jason Pacheco is a 57 y.o. year old male with a history of hypertension, hyperlipidemia, nicotine dependence, prediabetes (A1c 6.0) who presents today for a follow-up visit.   Interval History: Discussed the use of AI scribe software for clinical note transcription with the patient, who gave verbal consent to proceed.  He presents with frequent urination, which he attributes to his antihypertensive medications. He reports that the urination is particularly problematic during his work hours as a Museum/gallery exhibitions officer, and has been timing his medication intake to minimize disruption. The urination is described as 'flooding' and is not associated with dysuria or urgency. He denies nocturia.  He is of the opinion he is on a diuretic however his med list reveals he is on amlodipine and lisinopril  The patient also reports right-sided ankle edema, which he notices particularly when wearing ankle socks. He denies any associated pain or dietary changes.  He also mentions for his carpal tunnel syndrome, he had surgery and no longer requires gabapentin.  The patient is a current smoker and works at a cigarette factory, but is considering quitting and has previously tried nicotine patches.        Past Medical History:  Diagnosis Date   Hyperlipidemia    Hypertension    Neuromuscular disorder ([facility])    nerve pain wrists and arms    Past Surgical History:  Procedure Laterality Date   CARPAL TUNNEL RELEASE Right 12/26/2022   Procedure: RIGHT CARPAL TUNNEL RELEASE;  Surgeon: Tarry Kos, MD;  Location: [facility];  Service: Orthopedics;  Laterality: Right;   COLONOSCOPY      Family History  Problem Relation Age of Onset   Hypertension Mother    Heart disease Mother    Colon cancer Neg Hx    Esophageal cancer Neg Hx    Rectal cancer Neg Hx    Stomach cancer Neg  Hx     Social History   Socioeconomic History   Marital status: Single    Spouse name: Not on file   Number of children: Not on file   Years of education: Not on file   Highest education level: Not on file  Occupational History   Not on file  Tobacco Use   Smoking status: Every Day    Current packs/day: 1.00    Types: Cigarettes   Smokeless tobacco: Never  Vaping Use   Vaping status: Never Used  Substance and Sexual Activity   Alcohol use: Yes    Comment: 2 40 oz   Drug use: No   Sexual activity: Not on file  Other Topics Concern   Not on file  Social History Narrative   Not on file   Social Determinants of Health   Financial Resource Strain: Not on file  Food Insecurity: Not on file  Transportation Needs: Not on file  Physical Activity: Not on file  Stress: Not on file  Social Connections: Not on file    No Known Allergies  Outpatient Medications Prior to Visit  Medication Sig Dispense Refill   aspirin EC 81 MG tablet Take 1 tablet (81 mg total) by mouth daily. 30 tablet 2   nicotine (NICODERM CQ) 7 mg/24hr patch Place 1 patch (7 mg total) onto the skin daily. 28 patch 3   traMADol (ULTRAM) 50 MG tablet Take 1 tablet (50 mg total) by mouth  2 (two) times daily as needed. 10 tablet 0   Vitamin D, Ergocalciferol, (DRISDOL) 1.25 MG (50000 UNIT) CAPS capsule TAKE ONE TABLE BY MOUTH ONCE A WEEK. FOR 12 WEEKS. 12 capsule 0   amLODipine (NORVASC) 5 MG tablet Take 1 tablet (5 mg total) by mouth daily. 90 tablet 1   gabapentin (NEURONTIN) 300 MG capsule Take 1 capsule (300 mg total) by mouth at bedtime. 90 capsule 1   lisinopril (ZESTRIL) 20 MG tablet Take 1 tablet (20 mg total) by mouth daily. 90 tablet 1   rosuvastatin (CRESTOR) 20 MG tablet Take 1 tablet (20 mg total) by mouth daily. 90 tablet 1   PEG-KCl-NaCl-NaSulf-Na Asc-C (PLENVU) 140 g SOLR Take 1 kit by mouth once. Colonoscopy prep sample provided (Patient not taking: Reported on 05/15/2023)      Facility-Administered Medications Prior to Visit  Medication Dose Route Frequency Provider Last Rate Last Admin   0.9 %  sodium chloride infusion  500 mL Intravenous Once Cirigliano, Vito V, DO         ROS Review of Systems  Constitutional:  Negative for activity change and appetite change.  HENT:  Negative for sinus pressure and sore throat.   Respiratory:  Negative for chest tightness, shortness of breath and wheezing.   Cardiovascular:  Positive for leg swelling. Negative for chest pain and palpitations.  Gastrointestinal:  Negative for abdominal distention, abdominal pain and constipation.  Genitourinary: Negative.   Musculoskeletal: Negative.   Psychiatric/Behavioral:  Negative for behavioral problems and dysphoric mood.     Objective:  BP 132/81   Pulse 66   Temp (!) 97.5 F (36.4 C) (Oral)   Ht 5\' 9"  (1.753 m)   Wt 183 lb 12.8 oz (83.4 kg)   SpO2 98%   BMI 27.14 kg/m      05/15/2023    2:29 PM 05/15/2023    2:00 PM 12/26/2022    1:57 PM  BP/Weight  Systolic BP 132 149 138  Diastolic BP 81 87 85  Wt. (Lbs)  183.8   BMI  27.14 kg/m2       Physical Exam Constitutional:      Appearance: He is well-developed.  Cardiovascular:     Rate and Rhythm: Normal rate.     Heart sounds: Normal heart sounds. No murmur heard. Pulmonary:     Effort: Pulmonary effort is normal.     Breath sounds: Normal breath sounds. No wheezing or rales.  Chest:     Chest wall: No tenderness.  Abdominal:     General: Bowel sounds are normal. There is no distension.     Palpations: Abdomen is soft. There is no mass.     Tenderness: There is no abdominal tenderness.  Musculoskeletal:        General: Normal range of motion.     Right lower leg: Edema present.     Left lower leg: No edema.  Neurological:     Mental Status: He is alert and oriented to person, place, and time.  Psychiatric:        Mood and Affect: Mood normal.        Latest Ref Rng & Units 10/25/2022    2:14 PM  07/16/2022   10:40 AM 04/13/2020    9:43 AM  CMP  Glucose 70 - 99 mg/dL 93  91  96   BUN 6 - 24 mg/dL 16  14  16    Creatinine 0.76 - 1.27 mg/dL 7.25  3.66  4.40   Sodium  134 - 144 mmol/L 140  143  143   Potassium 3.5 - 5.2 mmol/L 4.2  5.1  4.2   Chloride 96 - 106 mmol/L 105  104  105   CO2 20 - 29 mmol/L 23  20  20    Calcium 8.7 - 10.2 mg/dL 9.4  9.8  9.8   Total Protein 6.0 - 8.5 g/dL  7.3  7.7   Total Bilirubin 0.0 - 1.2 mg/dL  0.7  1.0   Alkaline Phos 44 - 121 IU/L  70  69   AST 0 - 40 IU/L  24  22   ALT 0 - 44 IU/L  35  33     Lipid Panel     Component Value Date/Time   CHOL 162 07/16/2022 1040   TRIG 51 07/16/2022 1040   HDL 78 07/16/2022 1040   CHOLHDL 3.1 04/13/2020 0943   CHOLHDL 3.2 01/09/2017 1707   VLDL 19 01/09/2017 1707   LDLCALC 73 07/16/2022 1040    CBC    Component Value Date/Time   WBC 3.8 07/16/2022 1040   WBC 5.5 12/18/2015 0308   RBC 5.51 07/16/2022 1040   RBC 4.70 12/18/2015 0308   HGB 15.1 07/16/2022 1040   HCT 46.6 07/16/2022 1040   PLT 202 07/16/2022 1040   MCV 85 07/16/2022 1040   MCH 27.4 07/16/2022 1040   MCH 28.7 12/18/2015 0308   MCHC 32.4 07/16/2022 1040   MCHC 32.8 12/18/2015 0308   RDW 13.1 07/16/2022 1040   LYMPHSABS 1.2 07/16/2022 1040   MONOABS 0.5 12/17/2015 1556   EOSABS 0.1 07/16/2022 1040   BASOSABS 0.0 07/16/2022 1040    Lab Results  Component Value Date   HGBA1C 5.6 (A) 05/15/2023    Assessment & Plan:     Prediabetes: -Diet controlled with A1c of 5.6  Hyperlipidemia: -Controlled -Continue Crestor  Lower urinary tract symptoms: - Reports frequent urination only after taking blood pressure medication. No nocturia. No urgency or straining. No diuretic in current medication list. Possible benign prostatic hyperplasia (BPH) vs medication side effect. -Consider trial of Flomax for possible BPH. -Check PSA today (last checked in 2019).  Hypertension:  -Initially elevated but repeat blood pressure is normal. -He  endorses not taking antihypertensive today -Continue Lisinopril and Amlodipine. -Encourage consistent medication use.  Ankle Edema:  -Reports occasional ankle swelling, particularly when wearing ankle socks. No pain. -Consider use of compression stockings to help manage edema.  Carpal Tunnel Syndrome: - Reports no longer needing Gabapentin following carpal tunnel surgery. -Discontinue Gabapentin.  Tobacco Use: Reports ongoing smoking, but considering quitting. -Encourage continued efforts to quit smoking. -Remind patient to use nicotine patches  Follow-up: -Check blood pressure at next visit. -Review response to Flomax (if started) at next visit.           Meds ordered this encounter  Medications   amLODipine (NORVASC) 5 MG tablet    Sig: Take 1 tablet (5 mg total) by mouth daily.    Dispense:  90 tablet    Refill:  1   lisinopril (ZESTRIL) 20 MG tablet    Sig: Take 1 tablet (20 mg total) by mouth daily.    Dispense:  90 tablet    Refill:  1   rosuvastatin (CRESTOR) 20 MG tablet    Sig: Take 1 tablet (20 mg total) by mouth daily.    Dispense:  90 tablet    Refill:  1   tamsulosin (FLOMAX) 0.4 MG CAPS capsule    Sig:  Take 1 capsule (0.4 mg total) by mouth daily.    Dispense:  90 capsule    Refill:  1    Follow-up: Return in about 6 months (around 11/15/2023) for Chronic medical conditions.       Hoy Register, MD, FAAFP. [facility] [place], [place] [phone]   05/15/2023, 2:45 PM

## 2023-05-16 ENCOUNTER — Other Ambulatory Visit: Payer: Self-pay

## 2023-05-16 LAB — CMP14+EGFR
ALT: 26 IU/L (ref 0–44)
AST: 23 IU/L (ref 0–40)
Albumin: 4.2 g/dL (ref 3.8–4.9)
Alkaline Phosphatase: 75 IU/L (ref 44–121)
BUN/Creatinine Ratio: 14 (ref 9–20)
BUN: 16 mg/dL (ref 6–24)
Bilirubin Total: 0.6 mg/dL (ref 0.0–1.2)
CO2: 21 mmol/L (ref 20–29)
Calcium: 9.1 mg/dL (ref 8.7–10.2)
Chloride: 107 mmol/L — ABNORMAL HIGH (ref 96–106)
Creatinine, Ser: 1.17 mg/dL (ref 0.76–1.27)
Globulin, Total: 2.6 g/dL (ref 1.5–4.5)
Glucose: 91 mg/dL (ref 70–99)
Potassium: 4.2 mmol/L (ref 3.5–5.2)
Sodium: 141 mmol/L (ref 134–144)
Total Protein: 6.8 g/dL (ref 6.0–8.5)
eGFR: 72 mL/min/{1.73_m2} (ref >59)

## 2023-05-16 LAB — PSA, TOTAL AND FREE
PSA, Free Pct: 16.9 %
PSA, Free: 0.27 ng/mL
Prostate Specific Ag, Serum: 1.6 ng/mL (ref 0.0–4.0)

## 2023-05-20 ENCOUNTER — Other Ambulatory Visit: Payer: Self-pay

## 2023-05-21 ENCOUNTER — Other Ambulatory Visit: Payer: Self-pay

## 2023-05-21 ENCOUNTER — Other Ambulatory Visit: Payer: Self-pay | Admitting: Pharmacist

## 2023-05-21 DIAGNOSIS — E782 Mixed hyperlipidemia: Secondary | ICD-10-CM

## 2023-05-21 MED ORDER — ATORVASTATIN CALCIUM 40 MG PO TABS
40.0000 mg | ORAL_TABLET | Freq: Every day | ORAL | 3 refills | Status: DC
  Filled 2023-05-21: qty 90, 90d supply, fill #0

## 2023-05-22 ENCOUNTER — Other Ambulatory Visit: Payer: Self-pay

## 2023-05-23 ENCOUNTER — Other Ambulatory Visit: Payer: Self-pay

## 2023-05-29 ENCOUNTER — Other Ambulatory Visit: Payer: Self-pay

## 2023-07-25 ENCOUNTER — Other Ambulatory Visit: Payer: Self-pay

## 2023-08-29 ENCOUNTER — Other Ambulatory Visit: Payer: Self-pay

## 2023-09-30 ENCOUNTER — Other Ambulatory Visit: Payer: Self-pay

## 2023-10-25 ENCOUNTER — Other Ambulatory Visit: Payer: Self-pay

## 2023-12-02 ENCOUNTER — Other Ambulatory Visit: Payer: Self-pay

## 2024-01-31 ENCOUNTER — Other Ambulatory Visit: Payer: Self-pay

## 2024-01-31 ENCOUNTER — Other Ambulatory Visit: Payer: Self-pay | Admitting: Family Medicine

## 2024-01-31 DIAGNOSIS — I10 Essential (primary) hypertension: Secondary | ICD-10-CM

## 2024-02-04 ENCOUNTER — Other Ambulatory Visit: Payer: Self-pay

## 2024-02-06 ENCOUNTER — Other Ambulatory Visit: Payer: Self-pay

## 2024-02-27 ENCOUNTER — Encounter (HOSPITAL_COMMUNITY): Payer: Self-pay

## 2024-02-27 ENCOUNTER — Other Ambulatory Visit: Payer: Self-pay

## 2024-02-27 ENCOUNTER — Ambulatory Visit (HOSPITAL_COMMUNITY)
Admission: EM | Admit: 2024-02-27 | Discharge: 2024-02-27 | Disposition: A | Discharge status: Home / Self Care | Attending: Family Medicine | Admitting: Family Medicine

## 2024-02-27 ENCOUNTER — Ambulatory Visit (INDEPENDENT_AMBULATORY_CARE_PROVIDER_SITE_OTHER)

## 2024-02-27 DIAGNOSIS — M79641 Pain in right hand: Secondary | ICD-10-CM

## 2024-02-27 DIAGNOSIS — I10 Essential (primary) hypertension: Secondary | ICD-10-CM

## 2024-02-27 MED ORDER — COLCHICINE 0.6 MG PO TABS
0.6000 mg | ORAL_TABLET | Freq: Every day | ORAL | 0 refills | Status: AC | PRN
  Filled 2024-02-27: qty 15, 15d supply, fill #0

## 2024-02-27 MED ORDER — PREDNISONE 20 MG PO TABS
40.0000 mg | ORAL_TABLET | Freq: Every day | ORAL | 0 refills | Status: AC
  Filled 2024-02-27: qty 10, 5d supply, fill #0

## 2024-02-27 MED ORDER — LISINOPRIL 20 MG PO TABS
20.0000 mg | ORAL_TABLET | Freq: Every day | ORAL | 0 refills | Status: DC
  Filled 2024-02-27: qty 30, 30d supply, fill #0

## 2024-02-27 MED ORDER — AMLODIPINE BESYLATE 5 MG PO TABS
5.0000 mg | ORAL_TABLET | Freq: Every day | ORAL | 0 refills | Status: DC
  Filled 2024-02-27: qty 30, 30d supply, fill #0

## 2024-02-27 NOTE — Discharge Instructions (Addendum)
 I do not see any broken bones on your x-rays. The radiologist will also read your x-ray, and if their interpretation differs significantly from mine, and the management of your condition would change, we will call you.  Take prednisone  20 mg--2 daily for 5 days  Colchicine  0.6 mg--1 tablet daily as needed for pain.  This will help if gout is the cause of your hand pain and swelling.  Restart taking lisinopril  20 mg--1 daily  And your amlodipine  5 mg--1 daily for blood pressure.  Please keep your follow-up with your primary care

## 2024-02-27 NOTE — ED Triage Notes (Signed)
 Pt states that he fell outside and used right hand to brace his fall. Pt states it is now swollen. Pt states that he had surgery about 1 yr ago and wants to make sure it's ok. Pt has tried ice and heat at home.

## 2024-02-27 NOTE — ED Provider Notes (Signed)
 [facility]    CSN: [id number] Arrival date & time: 02/27/24  1214      History   Chief Complaint Chief Complaint  Patient presents with   Hand Pain    HPI Jason Pacheco is a 58 y.o. male.    Hand Pain  Here for right hand pain and swelling.  On April 26 he fell onto his right hand.  He did not note any pain or swelling or deformity at that time.  Then on about April 29, he began having pain in his right middle finger and into his mid right palm.  The hand is swollen little bit.  It is hard to make a grip or touch his middle finger tip to his thumb.  He had carpal tunnel surgery on the right about 1 year ago and is concerned that might of messed something up.  No wrist pain.  NKDA  No history of diabetes.  He does have a history of hypertension and is out of his medications.  He will be seeing his primary care in the next month.   Past Medical History:  Diagnosis Date   Hyperlipidemia    Hypertension    Neuromuscular disorder (HCC)    nerve pain wrists and arms    Patient Active Problem List   Diagnosis Date Noted   Carpal tunnel syndrome on right 12/26/2022   Prediabetes 10/25/2022   Mixed hyperlipidemia 12/30/2018   Alcohol abuse 12/30/2018   Tobacco abuse 03/03/2018   Left upper extremity numbness    Syncope 12/17/2015   Essential hypertension 12/17/2015   Bilateral arm pain 12/17/2015    Past Surgical History:  Procedure Laterality Date   CARPAL TUNNEL RELEASE Right 12/26/2022   Procedure: RIGHT CARPAL TUNNEL RELEASE;  Surgeon: Wes Hamman, MD;  Location: [facility];  Service: Orthopedics;  Laterality: Right;   COLONOSCOPY         Home Medications    Prior to Admission medications   Medication Sig Start Date End Date Taking? Authorizing Provider  colchicine  0.6 MG tablet Take 1 tablet (0.6 mg total) by mouth daily as needed (gout pain). 02/27/24  Yes Ann Keto, MD  predniSONE  (DELTASONE ) 20 MG tablet Take 2  tablets (40 mg total) by mouth daily with breakfast for 5 days. 02/27/24 03/03/24 Yes Ann Keto, MD  amLODipine  (NORVASC ) 5 MG tablet Take 1 tablet (5 mg total) by mouth daily. 02/27/24   Ann Keto, MD  aspirin  EC 81 MG tablet Take 1 tablet (81 mg total) by mouth daily. 03/11/17   Carin Charleston, FNP  atorvastatin  (LIPITOR) 40 MG tablet Take 1 tablet (40 mg total) by mouth daily. 05/21/23   Newlin, Enobong, MD  lisinopril  (ZESTRIL ) 20 MG tablet Take 1 tablet (20 mg total) by mouth daily. 02/27/24   Ann Keto, MD  nicotine  (NICODERM CQ ) 7 mg/24hr patch Place 1 patch (7 mg total) onto the skin daily. 10/25/22   Newlin, Enobong, MD  PEG-KCl-NaCl-NaSulf-Na Asc-C (PLENVU) 140 g SOLR Take 1 kit by mouth once. Colonoscopy prep sample provided Patient not taking: Reported on 05/15/2023    [provider]  tamsulosin  (FLOMAX ) 0.4 MG CAPS capsule Take 1 capsule (0.4 mg total) by mouth daily. 05/15/23   Newlin, Enobong, MD  traMADol  (ULTRAM ) 50 MG tablet Take 1 tablet (50 mg total) by mouth 2 (two) times daily as needed. 12/26/22   Wes Hamman, MD  Vitamin D , Ergocalciferol , (DRISDOL ) 1.25 MG (50000 UNIT)  CAPS capsule TAKE ONE TABLE BY MOUTH ONCE A WEEK. FOR 12 WEEKS. 04/15/20   Hassie Lint, PA-C    Family History Family History  Problem Relation Age of Onset   Hypertension Mother    Heart disease Mother    Colon cancer Neg Hx    Esophageal cancer Neg Hx    Rectal cancer Neg Hx    Stomach cancer Neg Hx     Social History Social History   Tobacco Use   Smoking status: Every Day    Current packs/day: 1.00    Types: Cigarettes   Smokeless tobacco: Never  Vaping Use   Vaping status: Never Used  Substance Use Topics   Alcohol use: Yes    Comment: 2 40 oz   Drug use: No     Allergies   Patient has no known allergies.   Review of Systems Review of Systems   Physical Exam Triage Vital Signs ED Triage Vitals  Encounter Vitals Group     BP 02/27/24  1347 (!) 167/109     Systolic BP Percentile --      Diastolic BP Percentile --      Pulse Rate 02/27/24 1347 76     Resp 02/27/24 1347 18     Temp 02/27/24 1347 (!) 97.5 F (36.4 C)     Temp Source 02/27/24 1347 Oral     SpO2 02/27/24 1347 97 %     Weight --      Height --      Head Circumference --      Peak Flow --      Pain Score 02/27/24 1346 1     Pain Loc --      Pain Education --      Exclude from Growth Chart --    No data found.  Updated Vital Signs BP (!) 167/109 (BP Location: Left Arm)   Pulse 76   Temp (!) 97.5 F (36.4 C) (Oral)   Resp 18   SpO2 97%   Visual Acuity Right Eye Distance:   Left Eye Distance:   Bilateral Distance:    Right Eye Near:   Left Eye Near:    Bilateral Near:     Physical Exam Vitals reviewed.  Constitutional:      General: He is not in acute distress.    Appearance: He is not ill-appearing, toxic-appearing or diaphoretic.  Cardiovascular:     Rate and Rhythm: Normal rate and regular rhythm.  Musculoskeletal:     Comments: The right hand and fingers are mildly swollen.  There is some tenderness of the central palm.  No deformity.  The wrist is nontender  Skin:    Coloration: Skin is not pale.  Neurological:     Mental Status: He is alert.      UC Treatments / Results  Labs (all labs ordered are listed, but only abnormal results are displayed) Labs Reviewed - No data to display  EKG   Radiology No results found.  Procedures Procedures (including critical care time)  Medications Ordered in UC Medications - No data to display  Initial Impression / Assessment and Plan / UC Course  I have reviewed the triage vital signs and the nursing notes.  Pertinent labs & imaging results that were available during my care of the patient were reviewed by me and considered in my medical decision making (see chart for details).     X-ray by my review does not show any acute bony changes  or fractures.  He is advised of  radiology overread.  I do wonder if this is possibly gout.  Prednisone  is sent in for 5 days.  Colchicine  is sent in to see if that will give him any benefit also.  His lisinopril  and amlodipine  are sent in for a 1 month supply.  He will be seeing primary care in about 3 weeks.  I did discuss with him that he usually should be able to do an automatic refill request with his primary care in between appointments for his maintenance medications. Final Clinical Impressions(s) / UC Diagnoses   Final diagnoses:  Right hand pain     Discharge Instructions      I do not see any broken bones on your x-rays. The radiologist will also read your x-ray, and if their interpretation differs significantly from mine, and the management of your condition would change, we will call you.  Take prednisone  20 mg--2 daily for 5 days  Colchicine  0.6 mg--1 tablet daily as needed for pain.  This will help if gout is the cause of your hand pain and swelling.  Restart taking lisinopril  20 mg--1 daily  And your amlodipine  5 mg--1 daily for blood pressure.  Please keep your follow-up with your primary care     ED Prescriptions     Medication Sig Dispense Auth. Provider   amLODipine  (NORVASC ) 5 MG tablet Take 1 tablet (5 mg total) by mouth daily. 30 tablet Mettie Roylance, Paige Boatman, MD   lisinopril  (ZESTRIL ) 20 MG tablet Take 1 tablet (20 mg total) by mouth daily. 30 tablet Hana Trippett K, MD   predniSONE  (DELTASONE ) 20 MG tablet Take 2 tablets (40 mg total) by mouth daily with breakfast for 5 days. 10 tablet Ann Keto, MD   colchicine  0.6 MG tablet Take 1 tablet (0.6 mg total) by mouth daily as needed (gout pain). 15 tablet Karsyn Jamie K, MD      PDMP not reviewed this encounter.   Ann Keto, MD 02/27/24 [phone]

## 2024-03-20 ENCOUNTER — Ambulatory Visit: Attending: Nurse Practitioner | Admitting: Nurse Practitioner

## 2024-03-20 ENCOUNTER — Encounter: Payer: Self-pay | Admitting: Nurse Practitioner

## 2024-03-20 ENCOUNTER — Other Ambulatory Visit: Payer: Self-pay

## 2024-03-20 VITALS — BP 136/83 | HR 71 | Ht 69.0 in | Wt 200.4 lb

## 2024-03-20 DIAGNOSIS — I10 Essential (primary) hypertension: Secondary | ICD-10-CM | POA: Diagnosis not present

## 2024-03-20 DIAGNOSIS — R7303 Prediabetes: Secondary | ICD-10-CM

## 2024-03-20 DIAGNOSIS — E559 Vitamin D deficiency, unspecified: Secondary | ICD-10-CM | POA: Diagnosis not present

## 2024-03-20 DIAGNOSIS — E782 Mixed hyperlipidemia: Secondary | ICD-10-CM

## 2024-03-20 DIAGNOSIS — Z23 Encounter for immunization: Secondary | ICD-10-CM

## 2024-03-20 DIAGNOSIS — R350 Frequency of micturition: Secondary | ICD-10-CM

## 2024-03-20 MED ORDER — AMLODIPINE BESYLATE 5 MG PO TABS
5.0000 mg | ORAL_TABLET | Freq: Every day | ORAL | 1 refills | Status: DC
  Filled 2024-03-20 – 2024-03-23 (×2): qty 90, 90d supply, fill #0
  Filled 2024-07-14 (×2): qty 90, 90d supply, fill #1

## 2024-03-20 MED ORDER — LISINOPRIL 20 MG PO TABS
20.0000 mg | ORAL_TABLET | Freq: Every day | ORAL | 1 refills | Status: DC
  Filled 2024-03-20 – 2024-03-23 (×2): qty 90, 90d supply, fill #0
  Filled 2024-07-14 (×2): qty 90, 90d supply, fill #1

## 2024-03-20 MED ORDER — ATORVASTATIN CALCIUM 40 MG PO TABS
40.0000 mg | ORAL_TABLET | Freq: Every day | ORAL | 3 refills | Status: AC
  Filled 2024-03-20: qty 90, 90d supply, fill #0
  Filled 2024-07-14 (×2): qty 90, 90d supply, fill #1

## 2024-03-20 NOTE — Progress Notes (Signed)
 Assessment & Plan:   Jason Pacheco was seen today for medical management of chronic issues.  Diagnoses and all orders for this visit:  Essential hypertension -     amLODipine  (NORVASC ) 5 MG tablet; Take 1 tablet (5 mg total) by mouth daily. -     lisinopril  (ZESTRIL ) 20 MG tablet; Take 1 tablet (20 mg total) by mouth daily. Continue all antihypertensives as prescribed.  Reminded to bring in blood pressure log for follow  up appointment.  RECOMMENDATIONS: DASH/Mediterranean Diets are healthier choices for HTN.    Mixed hyperlipidemia -     atorvastatin  (LIPITOR) 40 MG tablet; Take 1 tablet (40 mg total) by mouth daily. INSTRUCTIONS: Work on a low fat, heart healthy diet and participate in regular aerobic exercise program by working out at least 150 minutes per week; 5 days a week-30 minutes per day. Avoid red meat/beef/steak,  fried foods. junk foods, sodas, sugary drinks, unhealthy snacking, alcohol and smoking.  Drink at least 80 oz of water per day and monitor your carbohydrate intake daily.    Need for pneumococcal 20-valent conjugate vaccination -     Pneumococcal conjugate vaccine 20-valent  Vitamin D  deficiency -     VITAMIN D  25 Hydroxy (Vit-D Deficiency, Fractures)  Prediabetes -     CMP14+EGFR -     Hemoglobin A1c  Urine frequency -     PSA    Patient has been counseled on age-appropriate routine health concerns for screening and prevention. These are reviewed and up-to-date. Referrals have been placed accordingly. Immunizations are up-to-date or declined.    Subjective:   Chief Complaint  Patient presents with   Medical Management of Chronic Issues    No concerns    Jason Pacheco 57 y.o. male presents to office today for medication refills and hypertension. He is a patient of Dr. Newlin.  Past medical history includes neuropathy, hyperlipidemia and hypertension.   HTN Blood pressure is well-controlled today with amlodipine  and lisinopril . BP Readings from Last 3  Encounters:  03/20/24 136/83  02/27/24 (!) 167/109  05/15/23 132/81     LDL at goal of less than 100 with atorvastatin  40 mg daily although he states he has been without this medication for quite some time.  He is not fasting currently so we will not be able to draw a lipid panel today Lab Results  Component Value Date   LDLCALC 73 07/16/2022     Review of Systems  Constitutional:  Negative for fever, malaise/fatigue and weight loss.  HENT: Negative.  Negative for nosebleeds.   Eyes: Negative.  Negative for blurred vision, double vision and photophobia.  Respiratory: Negative.  Negative for cough and shortness of breath.   Cardiovascular: Negative.  Negative for chest pain, palpitations and leg swelling.  Gastrointestinal: Negative.  Negative for heartburn, nausea and vomiting.  Genitourinary:  Positive for frequency.  Musculoskeletal: Negative.  Negative for myalgias.  Neurological: Negative.  Negative for dizziness, focal weakness, seizures and headaches.  Psychiatric/Behavioral: Negative.  Negative for suicidal ideas.     Past Medical History:  Diagnosis Date   Hyperlipidemia    Hypertension    Neuromuscular disorder (HCC)    nerve pain wrists and arms    Past Surgical History:  Procedure Laterality Date   CARPAL TUNNEL RELEASE Right 12/26/2022   Procedure: RIGHT CARPAL TUNNEL RELEASE;  Surgeon: Wes Hamman, MD;  Location: [facility];  Service: Orthopedics;  Laterality: Right;   COLONOSCOPY      Family History  Problem Relation Age of Onset   Hypertension Mother    Heart disease Mother    Colon cancer Neg Hx    Esophageal cancer Neg Hx    Rectal cancer Neg Hx    Stomach cancer Neg Hx     Social History Reviewed with no changes to be made today.   Outpatient Medications Prior to Visit  Medication Sig Dispense Refill   aspirin  EC 81 MG tablet Take 1 tablet (81 mg total) by mouth daily. 30 tablet 2   Vitamin D , Ergocalciferol , (DRISDOL ) 1.25 MG  (50000 UNIT) CAPS capsule TAKE ONE TABLE BY MOUTH ONCE A WEEK. FOR 12 WEEKS. 12 capsule 0   amLODipine  (NORVASC ) 5 MG tablet Take 1 tablet (5 mg total) by mouth daily. 30 tablet 0   atorvastatin  (LIPITOR) 40 MG tablet Take 1 tablet (40 mg total) by mouth daily. 90 tablet 3   lisinopril  (ZESTRIL ) 20 MG tablet Take 1 tablet (20 mg total) by mouth daily. 30 tablet 0   colchicine  0.6 MG tablet Take 1 tablet (0.6 mg total) by mouth daily as needed (gout pain). (Patient not taking: Reported on 03/20/2024) 15 tablet 0   nicotine  (NICODERM CQ ) 7 mg/24hr patch Place 1 patch (7 mg total) onto the skin daily. (Patient not taking: Reported on 03/20/2024) 28 patch 3   PEG-KCl-NaCl-NaSulf-Na Asc-C (PLENVU) 140 g SOLR Take 1 kit by mouth once. Colonoscopy prep sample provided (Patient not taking: Reported on 03/20/2024)     tamsulosin  (FLOMAX ) 0.4 MG CAPS capsule Take 1 capsule (0.4 mg total) by mouth daily. (Patient not taking: Reported on 03/20/2024) 90 capsule 1   traMADol  (ULTRAM ) 50 MG tablet Take 1 tablet (50 mg total) by mouth 2 (two) times daily as needed. (Patient not taking: Reported on 03/20/2024) 10 tablet 0   Facility-Administered Medications Prior to Visit  Medication Dose Route Frequency Provider Last Rate Last Admin   0.9 %  sodium chloride  infusion  500 mL Intravenous Once Cirigliano, Vito V, DO        No Known Allergies     Objective:    BP 136/83   Pulse 71   Ht 5\' 9"  (1.753 m)   Wt 200 lb 6.4 oz (90.9 kg)   SpO2 98%   BMI 29.59 kg/m  Wt Readings from Last 3 Encounters:  03/20/24 200 lb 6.4 oz (90.9 kg)  05/15/23 183 lb 12.8 oz (83.4 kg)  12/26/22 183 lb 6.8 oz (83.2 kg)    Physical Exam Vitals and nursing note reviewed.  Constitutional:      Appearance: He is well-developed.  HENT:     Head: Normocephalic and atraumatic.  Cardiovascular:     Rate and Rhythm: Regular rhythm.     Heart sounds: Normal heart sounds. No murmur heard.    No friction rub. No gallop.  Pulmonary:      Effort: Pulmonary effort is normal. No tachypnea or respiratory distress.     Breath sounds: Normal breath sounds. No decreased breath sounds, wheezing, rhonchi or rales.  Chest:     Chest wall: No tenderness.  Abdominal:     General: Bowel sounds are normal.     Palpations: Abdomen is soft.  Musculoskeletal:        General: Normal range of motion.     Cervical back: Normal range of motion.  Skin:    General: Skin is warm and dry.  Neurological:     Mental Status: He is alert and oriented to person, place, and  time.     Coordination: Coordination normal.  Psychiatric:        Behavior: Behavior normal. Behavior is cooperative.        Thought Content: Thought content normal.        Judgment: Judgment normal.          Patient has been counseled extensively about nutrition and exercise as well as the importance of adherence with medications and regular follow-up. The patient was given clear instructions to go to ER or return to medical center if symptoms don't improve, worsen or new problems develop. The patient verbalized understanding.   Follow-up: Return in about 6 months (around 09/20/2024) for f/u with PCP.   Collins Dean, FNP-BC [facility] [place], [place] [phone]   03/20/2024, 10:41 AM

## 2024-03-21 LAB — CMP14+EGFR
ALT: 19 IU/L (ref 0–44)
AST: 19 IU/L (ref 0–40)
Albumin: 4.4 g/dL (ref 3.8–4.9)
Alkaline Phosphatase: 75 IU/L (ref 44–121)
BUN/Creatinine Ratio: 13 (ref 9–20)
BUN: 17 mg/dL (ref 6–24)
Bilirubin Total: 1 mg/dL (ref 0.0–1.2)
CO2: 20 mmol/L (ref 20–29)
Calcium: 9.6 mg/dL (ref 8.7–10.2)
Chloride: 106 mmol/L (ref 96–106)
Creatinine, Ser: 1.36 mg/dL — ABNORMAL HIGH (ref 0.76–1.27)
Globulin, Total: 2.8 g/dL (ref 1.5–4.5)
Glucose: 104 mg/dL — ABNORMAL HIGH (ref 70–99)
Potassium: 4.5 mmol/L (ref 3.5–5.2)
Sodium: 141 mmol/L (ref 134–144)
Total Protein: 7.2 g/dL (ref 6.0–8.5)
eGFR: 60 mL/min/{1.73_m2} (ref >59)

## 2024-03-21 LAB — HEMOGLOBIN A1C
Est. average glucose Bld gHb Est-mCnc: 128 mg/dL
Hgb A1c MFr Bld: 6.1 % — ABNORMAL HIGH (ref 4.8–5.6)

## 2024-03-21 LAB — PSA: Prostate Specific Ag, Serum: 2.5 ng/mL (ref 0.0–4.0)

## 2024-03-21 LAB — VITAMIN D 25 HYDROXY (VIT D DEFICIENCY, FRACTURES): Vit D, 25-Hydroxy: 25.4 ng/mL — ABNORMAL LOW (ref 30.0–100.0)

## 2024-03-23 ENCOUNTER — Ambulatory Visit: Payer: Self-pay | Admitting: Nurse Practitioner

## 2024-03-24 ENCOUNTER — Other Ambulatory Visit: Payer: Self-pay

## 2024-03-27 ENCOUNTER — Other Ambulatory Visit: Payer: Self-pay

## 2024-07-14 ENCOUNTER — Other Ambulatory Visit: Payer: Self-pay

## 2024-11-17 ENCOUNTER — Other Ambulatory Visit: Payer: Self-pay | Admitting: Nurse Practitioner

## 2024-11-17 ENCOUNTER — Other Ambulatory Visit: Payer: Self-pay

## 2024-11-17 DIAGNOSIS — I10 Essential (primary) hypertension: Secondary | ICD-10-CM

## 2024-11-17 MED ORDER — AMLODIPINE BESYLATE 5 MG PO TABS
5.0000 mg | ORAL_TABLET | Freq: Every day | ORAL | 0 refills | Status: AC
  Filled 2024-11-17: qty 30, 30d supply, fill #0

## 2024-11-17 MED ORDER — LISINOPRIL 20 MG PO TABS
20.0000 mg | ORAL_TABLET | Freq: Every day | ORAL | 0 refills | Status: AC
  Filled 2024-11-17: qty 30, 30d supply, fill #0

## 2024-11-18 ENCOUNTER — Other Ambulatory Visit: Payer: Self-pay
